# Patient Record
Sex: Female | Born: 1935 | Race: White | Hispanic: No | Smoking: Never smoker
Health system: Southern US, Community
[De-identification: ages and names within clinical notes are randomized; demographics above are authoritative.]

## PROBLEM LIST (undated history)

## (undated) DIAGNOSIS — N8110 Cystocele, unspecified: Secondary | ICD-10-CM

## (undated) DIAGNOSIS — E079 Disorder of thyroid, unspecified: Secondary | ICD-10-CM

## (undated) HISTORY — PX: TOTAL HIP ARTHROPLASTY: SHX124

---

## 2020-06-29 ENCOUNTER — Ambulatory Visit: Payer: Medicare Other | Attending: Orthopedic Surgery | Admitting: Physical Therapy

## 2020-06-29 ENCOUNTER — Other Ambulatory Visit: Payer: Self-pay

## 2020-06-29 ENCOUNTER — Encounter: Payer: Self-pay | Admitting: Physical Therapy

## 2020-06-29 DIAGNOSIS — G8929 Other chronic pain: Secondary | ICD-10-CM | POA: Insufficient documentation

## 2020-06-29 DIAGNOSIS — M25562 Pain in left knee: Secondary | ICD-10-CM | POA: Diagnosis present

## 2020-06-29 DIAGNOSIS — M6281 Muscle weakness (generalized): Secondary | ICD-10-CM | POA: Diagnosis present

## 2020-06-29 NOTE — Therapy (Signed)
[facility] [address] [place], [place], [address] Phone: [phone]   Fax:  [phone]  Physical Therapy Evaluation  Patient Details  Name: Christina Walter MRN: [id number] Date of Birth: [birth year] Referring Provider (PT): Ranee Gosselin MD   Encounter Date: 06/29/2020   PT End of Session - 06/29/20 1041    Visit Number 1    Number of Visits 12    Date for PT Re-Evaluation 07/27/20    Authorization Type PROGRESS NOTE AT 10TH VISIT.  KX MODIFIER AFTER 15 VISITS.    PT Start Time [phone]    PT Stop Time 1032    PT Time Calculation (min) 51 min    Activity Tolerance Patient tolerated treatment well    Behavior During Therapy [facility] for tasks assessed/performed           History reviewed. No pertinent past medical history.  History reviewed. No pertinent surgical history.  There were no vitals filed for this visit.    Subjective Assessment - 06/29/20 1022    Subjective COVID-19 screen performed prior to patient entering clinic.  The patient presents to the clinic today with c/o left knee pain that has been ongoing since about Novemeber of 2021.  She also had a fall and fractured her left hip and underwent a left hip precaution.  She is abding by her hip precautions and not flxing her hip past 90 degrees, turning her toes in and not crossing legs.  She had a recent injection in her left knee which was very helpful and her pain is a low 1/10 today.  Prior to the injection she states she limped all the time.    Pertinent History OP, Thyroid problem, variose vein surgery, left THA (06/03/20...follow hip precautions), hysterectomy, breast cancer (2021).    How long can you walk comfortably? Short community distances with a FWW    Patient Stated Goals Greater ease in getting around.    Currently in Pain? Yes    Pain Score 1     Pain Orientation Left    Pain Descriptors / Indicators Dull;Aching    Pain Type Chronic pain    Pain Onset More than  a month ago    Pain Frequency Constant    Aggravating Factors  Increased up time.    Pain Relieving Factors Cortisone shot.              [facility] PT Assessment - 06/29/20 0001      Assessment   Medical Diagnosis Pain in left knee.    Referring Provider (PT) Ranee Gosselin MD    Onset Date/Surgical Date --   November 2021.     Precautions   Precaution Comments Left hip precautions.  PAIN-FREE LEFT LE THER EX.      Restrictions   Weight Bearing Restrictions No      Balance Screen   Has the patient fallen in the past 6 months Yes    How many times? 2.    Has the patient had a decrease in activity level because of a fear of falling?  No    Is the patient reluctant to leave their home because of a fear of falling?  No      Home Environment   Living Environment Private residence      Prior Function   Level of Independence Independent      Observation/Other Assessments-Edema    Edema Circumferential      Circumferential Edema   Circumferential - Right LT 3  cms > RT.      Posture/Postural Control   Posture Comments Left knee genu valgum.      ROM / Strength   AROM / PROM / Strength AROM;Strength      AROM   Overall AROM Comments Full left knee extension in supine and flexion to 120 degrees.      Strength   Overall Strength Comments Left hip flexion and abduction is 3 to 3+/5, left knee extension is 4-/5 and there is notable quadriceps atrophy.      Palpation   Patella mobility Normal left patellar mobility.    Palpation comment Tender to palpation over patient's left knee medial joint line and Pes Anserine.      Ambulation/Gait   Gait Comments Safe ambulation with a FWW.                      Objective measurements completed on examination: See above findings.       [facility] Adult PT Treatment/Exercise - 06/29/20 0001      Modalities   Modalities Electrical Stimulation;Vasopneumatic      Electrical Stimulation   Electrical Stimulation Location  Left medial knee.    Electrical Stimulation Action IFC at 80-150 Hz.    Electrical Stimulation Parameters 40% scan x 15 minutes.    Electrical Stimulation Goals Edema;Pain      Vasopneumatic   Number Minutes Vasopneumatic  15 minutes    Vasopnuematic Location  --   Left knee.   Vasopneumatic Pressure Low                       PT Long Term Goals - 06/29/20 1113      PT LONG TERM GOAL #1   Title Independent with a HEP.    Time 4    Period Weeks    Status New      PT LONG TERM GOAL #2   Title Perform ADL's with left knee pain not > 2-3/10.    Time 4    Period Weeks    Status New      PT LONG TERM GOAL #3   Title Improve left LE strength to a solid 4+/5 to increasestability for fucntional tasks.    Time 4    Period Weeks    Status New                  Plan - 06/29/20 1108    Clinical Impression Statement The patient presents to OPPT with c/o left knee pain that has been ongoing for several months.  A recent injection was very helpful.  Her knee is remarkable for genu valgum.  Her left hip is weak and she recently had surgery (06/03/20) and she is abiding by her hip precuations.  She has notable left quadriceps atrophy and edema when contralaterally compared.  She is walking safely with a FWW.  Her patellar mobility is normal.  She is tender to palpation over her left knee medial joint line and Pes Anserine region.  Patient will benefit from skilled physical therapy intervention to address pain and deficits.    Personal Factors and Comorbidities Comorbidity 1;Comorbidity 2;Other    Comorbidities OP, Thyroid problem, variose vein surgery, left THA (06/03/20...follow hip precautions), hysterectomy, breast cancer (2021).    Examination-Activity Limitations Locomotion Level;Other    Examination-Participation Restrictions Other    Stability/Clinical Decision Making Stable/Uncomplicated    Clinical Decision Making Low    Rehab Potential Excellent  PT Frequency 2x /  week    PT Duration 4 weeks    PT Treatment/Interventions ADLs/Self Care Home Management;Electrical Stimulation;Cryotherapy;Moist Heat;Ultrasound;Neuromuscular re-education;Therapeutic activities;Functional mobility training;Gait training;Patient/family education;Manual techniques;Passive range of motion;Vasopneumatic Device    PT Next Visit Plan Pain-free left LE ther ex.  Left hip precautions.  Modalites and STW/M as needed.    Consulted and Agree with Plan of Care Patient           Patient will benefit from skilled therapeutic intervention in order to improve the following deficits and impairments:  Abnormal gait,Pain,Decreased activity tolerance,Increased edema,Decreased strength,Decreased range of motion  Visit Diagnosis: Chronic pain of left knee - Plan: PT plan of care cert/re-cert  Muscle weakness (generalized) - Plan: PT plan of care cert/re-cert     Problem List There are no problems to display for this patient.   Adric Wrede, [place] MPT 06/29/2020, 11:19 AM  [facility] [address] [place], [place], [address] Phone: [phone]   Fax:  [phone]  Name: Christina Walter MRN: [id number] Date of Birth: Oct 08, 1934

## 2020-07-01 ENCOUNTER — Other Ambulatory Visit: Payer: Self-pay

## 2020-07-01 ENCOUNTER — Ambulatory Visit: Payer: Medicare Other | Admitting: *Deleted

## 2020-07-01 DIAGNOSIS — M25562 Pain in left knee: Secondary | ICD-10-CM | POA: Diagnosis not present

## 2020-07-01 DIAGNOSIS — M6281 Muscle weakness (generalized): Secondary | ICD-10-CM

## 2020-07-01 DIAGNOSIS — G8929 Other chronic pain: Secondary | ICD-10-CM

## 2020-07-01 NOTE — Therapy (Signed)
[facility] [address] [place], [place], [address] Phone: [phone]   Fax:  [phone]  Physical Therapy Treatment  Patient Details  Name: Shawnell Dykes MRN: [id number] Date of Birth: [birth year] Referring Provider (PT): Ranee Gosselin MD   Encounter Date: 07/01/2020   PT End of Session - 07/01/20 1223    Visit Number 2    Number of Visits 12    Date for PT Re-Evaluation 07/27/20    Authorization Type PROGRESS NOTE AT 10TH VISIT.  KX MODIFIER AFTER 15 VISITS.    PT Start Time 0945    PT Stop Time 1031    PT Time Calculation (min) 46 min           No past medical history on file.  No past surgical history on file.  There were no vitals filed for this visit.   Subjective Assessment - 07/01/20 0957    Subjective COVID-19 screen performed prior to patient entering clinic.Doing good    Pertinent History OP, Thyroid problem, variose vein surgery, left THA (06/03/20...follow hip precautions), hysterectomy, breast cancer (2021).    How long can you walk comfortably? Short community distances with a FWW    Patient Stated Goals Greater ease in getting around.    Currently in Pain? Yes    Pain Score 2     Pain Orientation Left    Pain Descriptors / Indicators Aching;Dull                             [facility] Adult PT Treatment/Exercise - 07/01/20 0001      Self-Care   Self-Care --   HEP handout given     Exercises   Exercises Knee/Hip;Ankle      Knee/Hip Exercises: Aerobic   Nustep L1 x 10 mins within hip precations. Pt to focus on good Jt alignment LT LE and prevent hip IR      Knee/Hip Exercises: Standing   Heel Raises Both;2 sets;10 reps    Heel Raises Limitations Toe raises 2x10    Hip Abduction AROM;Left;2 sets;10 reps    Rocker Board 4 minutes   PF/DF CGA     Knee/Hip Exercises: Seated   Long Arc Quad Strengthening;AROM;Left;1 set;10 reps   2x10 with 2# Wt. and ball at knees                       PT Long Term Goals - 06/29/20 1113      PT LONG TERM GOAL #1   Title Independent with a HEP.    Time 4    Period Weeks    Status New      PT LONG TERM GOAL #2   Title Perform ADL's with left knee pain not > 2-3/10.    Time 4    Period Weeks    Status New      PT LONG TERM GOAL #3   Title Improve left LE strength to a solid 4+/5 to increasestability for fucntional tasks.    Time 4    Period Weeks    Status New                 Plan - 07/01/20 1224    Clinical Impression Statement Pt arrived today using FWW for ambulation and doing fairly well. She was able to perform Nustep today , but needed to focus on preventing LT hip IR while performing. Rx focused on sitting and  standing LT LE strengthening in sitting as well as sitting. No reports of increased LT knee pain with today's Rx.    Comorbidities OP, Thyroid problem, variose vein surgery, left THA (06/03/20...follow hip precautions), hysterectomy, breast cancer (2021).    Stability/Clinical Decision Making Stable/Uncomplicated    Rehab Potential Excellent    PT Frequency 2x / week    PT Duration 4 weeks    PT Treatment/Interventions ADLs/Self Care Home Management;Electrical Stimulation;Cryotherapy;Moist Heat;Ultrasound;Neuromuscular re-education;Therapeutic activities;Functional mobility training;Gait training;Patient/family education;Manual techniques;Passive range of motion;Vasopneumatic Device    PT Next Visit Plan Pain-free left LE ther ex.  Left hip precautions.  Modalites and STW/M as needed.           Patient will benefit from skilled therapeutic intervention in order to improve the following deficits and impairments:  Abnormal gait,Pain,Decreased activity tolerance,Increased edema,Decreased strength,Decreased range of motion  Visit Diagnosis: Chronic pain of left knee  Muscle weakness (generalized)     Problem List There are no problems to display for this  patient.   Emmagene Ortner,CHRIS, PTA 07/01/2020, 12:28 PM  [facility] [address] [place], [place], [address] Phone: [phone]   Fax:  [phone]  Name: Elna Radovich MRN: [id number] Date of Birth: [birth year]

## 2020-07-05 ENCOUNTER — Encounter: Payer: Medicare Other | Admitting: *Deleted

## 2020-07-06 ENCOUNTER — Other Ambulatory Visit: Payer: Self-pay

## 2020-07-06 ENCOUNTER — Encounter: Payer: Self-pay | Admitting: Physical Therapy

## 2020-07-06 ENCOUNTER — Ambulatory Visit: Payer: Medicare Other | Admitting: Physical Therapy

## 2020-07-06 DIAGNOSIS — M25562 Pain in left knee: Secondary | ICD-10-CM | POA: Diagnosis not present

## 2020-07-06 DIAGNOSIS — M6281 Muscle weakness (generalized): Secondary | ICD-10-CM

## 2020-07-06 DIAGNOSIS — G8929 Other chronic pain: Secondary | ICD-10-CM

## 2020-07-06 NOTE — Therapy (Signed)
[facility] [address] [place], [place], [address] Phone: [phone]   Fax:  [phone]  Physical Therapy Treatment  Patient Details  Name: Christina Walter MRN: [id number] Date of Birth: [birth year] Referring Provider (PT): Ranee Gosselin MD   Encounter Date: 07/06/2020   PT End of Session - 07/06/20 0952    Visit Number 3    Number of Visits 12    Date for PT Re-Evaluation 07/27/20    Authorization Type PROGRESS NOTE AT 10TH VISIT.  KX MODIFIER AFTER 15 VISITS.    PT Start Time [phone]    PT Stop Time 1030    PT Time Calculation (min) 43 min    Activity Tolerance Patient tolerated treatment well    Behavior During Therapy [facility] for tasks assessed/performed           History reviewed. No pertinent past medical history.  History reviewed. No pertinent surgical history.  There were no vitals filed for this visit.   Subjective Assessment - 07/06/20 0951    Subjective COVID-19 screen performed prior to patient entering clinic.Doing good    Pertinent History OP, Thyroid problem, variose vein surgery, left THA (06/03/20...follow hip precautions), hysterectomy, breast cancer (2021).    How long can you walk comfortably? Short community distances with a FWW    Patient Stated Goals Greater ease in getting around.    Currently in Pain? No/denies              [facility] PT Assessment - 07/06/20 0001      Assessment   Medical Diagnosis Pain in left knee.    Referring Provider (PT) Ranee Gosselin MD      Precautions   Precaution Comments Left hip precautions.  PAIN-FREE LEFT LE THER EX.                         [facility] Adult PT Treatment/Exercise - 07/06/20 0001      Knee/Hip Exercises: Aerobic   Nustep L3 x18 min   focus on avoiding hip adduction LLE     Knee/Hip Exercises: Standing   Heel Raises Both;2 sets;10 reps    Heel Raises Limitations Toe raises 2x10    Hip Flexion AROM;Both;2 sets;10 reps;Knee bent    Hip  Abduction AROM;Both;2 sets;10 reps;Knee straight    Functional Squat 15 reps;3 seconds   mini squat     Knee/Hip Exercises: Seated   Long Arc Quad AROM;Left;2 sets;10 reps    Long Arc Quad Limitations LAQ with ball squeeze x20 reps    Hamstring Curl Strengthening;Left;2 sets;10 reps;Limitations    Hamstring Limitations yellow theraband      Knee/Hip Exercises: Supine   Bridges Strengthening;20 reps                       PT Long Term Goals - 07/06/20 1153      PT LONG TERM GOAL #1   Title Independent with a HEP.    Time 4    Period Weeks    Status Achieved      PT LONG TERM GOAL #2   Title Perform ADL's with left knee pain not > 2-3/10.    Time 4    Period Weeks    Status On-going      PT LONG TERM GOAL #3   Title Improve left LE strength to a solid 4+/5 to increasestability for fucntional tasks.    Time 4    Period Weeks  Status On-going                 Plan - 07/06/20 1153    Clinical Impression Statement Patient presented in clinic with reports of no pain and compliance with HEP. Patient progressed through AROM or lightly resisted exercises with fatigue reported. Weakness noted especially with L hip abduction and seated clam.    Personal Factors and Comorbidities Comorbidity 1;Comorbidity 2;Other    Comorbidities OP, Thyroid problem, variose vein surgery, left THA (06/03/20...follow hip precautions), hysterectomy, breast cancer (2021).    Examination-Activity Limitations Locomotion Level;Other    Examination-Participation Restrictions Other    Stability/Clinical Decision Making Stable/Uncomplicated    Rehab Potential Excellent    PT Frequency 2x / week    PT Duration 4 weeks    PT Treatment/Interventions ADLs/Self Care Home Management;Electrical Stimulation;Cryotherapy;Moist Heat;Ultrasound;Neuromuscular re-education;Therapeutic activities;Functional mobility training;Gait training;Patient/family education;Manual techniques;Passive range of  motion;Vasopneumatic Device    PT Next Visit Plan Pain-free left LE ther ex.  Left hip precautions.  Modalites and STW/M as needed.    Consulted and Agree with Plan of Care Patient           Patient will benefit from skilled therapeutic intervention in order to improve the following deficits and impairments:  Abnormal gait,Pain,Decreased activity tolerance,Increased edema,Decreased strength,Decreased range of motion  Visit Diagnosis: Chronic pain of left knee  Muscle weakness (generalized)     Problem List There are no problems to display for this patient.   Marvell Fuller, PTA 07/06/2020, 11:57 AM  [facility] [address] [place], [place], [address] Phone: [phone]   Fax:  [phone]  Name: Amyjo Mizrachi MRN: [id number] Date of Birth: June 18, 1934

## 2020-07-08 ENCOUNTER — Ambulatory Visit: Payer: Medicare Other | Admitting: *Deleted

## 2020-07-08 ENCOUNTER — Other Ambulatory Visit: Payer: Self-pay

## 2020-07-08 DIAGNOSIS — M6281 Muscle weakness (generalized): Secondary | ICD-10-CM

## 2020-07-08 DIAGNOSIS — M25562 Pain in left knee: Secondary | ICD-10-CM | POA: Diagnosis not present

## 2020-07-08 DIAGNOSIS — G8929 Other chronic pain: Secondary | ICD-10-CM

## 2020-07-08 NOTE — Therapy (Signed)
[facility] [address] [place], [place], [address] Phone: [phone]   Fax:  [phone]  Physical Therapy Treatment  Patient Details  Name: Christina Walter MRN: [id number] Date of Birth: Feb 14, 1935 Referring Provider (PT): Ranee Gosselin MD   Encounter Date: 07/08/2020   PT End of Session - 07/08/20 0949    Visit Number 4    Number of Visits 12    Date for PT Re-Evaluation 07/27/20    Authorization Type PROGRESS NOTE AT 10TH VISIT.  KX MODIFIER AFTER 15 VISITS.    PT Start Time 0945    PT Stop Time 1034    PT Time Calculation (min) 49 min           No past medical history on file.  No past surgical history on file.  There were no vitals filed for this visit.   Subjective Assessment - 07/08/20 0947    Subjective COVID-19 screen performed prior to patient entering clinic.Doing good    Pertinent History OP, Thyroid problem, variose vein surgery, left THA (06/03/20...follow hip precautions), hysterectomy, breast cancer (2021).    How long can you walk comfortably? Short community distances with a FWW    Patient Stated Goals Greater ease in getting around.    Currently in Pain? Yes    Pain Score 2     Pain Location Knee    Pain Orientation Left    Pain Descriptors / Indicators Aching;Dull    Pain Type Chronic pain                             [facility] Adult PT Treatment/Exercise - 07/08/20 0001      Exercises   Exercises Knee/Hip;Ankle      Knee/Hip Exercises: Aerobic   Nustep L3 x61min   focus on avoiding hip adduction/ IR LLE     Knee/Hip Exercises: Standing   Heel Raises Both;2 sets;10 reps    Heel Raises Limitations Toe raises 2x10    Hip Flexion AROM;Both;2 sets;10 reps;Knee bent   6 in box   Hip Abduction AROM;Both;2 sets;10 reps;Knee straight    Functional Squat 15 reps;3 seconds   on raised mat. Cues for technique     Knee/Hip Exercises: Seated   Long Arc Quad AROM;Left;2 sets;10 reps   hold 5  secs   Long Arc Quad Weight 2 lbs.    Hamstring Curl Strengthening;Left;2 sets;10 reps;Limitations    Hamstring Limitations yellow theraband                       PT Long Term Goals - 07/06/20 1153      PT LONG TERM GOAL #1   Title Independent with a HEP.    Time 4    Period Weeks    Status Achieved      PT LONG TERM GOAL #2   Title Perform ADL's with left knee pain not > 2-3/10.    Time 4    Period Weeks    Status On-going      PT LONG TERM GOAL #3   Title Improve left LE strength to a solid 4+/5 to increasestability for fucntional tasks.    Time 4    Period Weeks    Status On-going                 Plan - 07/08/20 1155    Clinical Impression Statement Pt arrived today doing fairly well with no  pain in LT knee. She was able to perform standing and seated exs for LT LE with in hip precautions and did well. Pt  able to focus on keeping LT hip in neutral position and not adducting/ IR.    Personal Factors and Comorbidities Comorbidity 1;Comorbidity 2;Other    Comorbidities OP, Thyroid problem, variose vein surgery, left THA (06/03/20...follow hip precautions), hysterectomy, breast cancer (2021).    Examination-Activity Limitations Locomotion Level;Other    Examination-Participation Restrictions Other    Rehab Potential Excellent    PT Frequency 2x / week    PT Duration 4 weeks    PT Treatment/Interventions ADLs/Self Care Home Management;Electrical Stimulation;Cryotherapy;Moist Heat;Ultrasound;Neuromuscular re-education;Therapeutic activities;Functional mobility training;Gait training;Patient/family education;Manual techniques;Passive range of motion;Vasopneumatic Device    PT Next Visit Plan Pain-free left LE ther ex.  Left hip precautions.  Modalites and STW/M as needed.    Consulted and Agree with Plan of Care Patient           Patient will benefit from skilled therapeutic intervention in order to improve the following deficits and impairments:  Abnormal  gait,Pain,Decreased activity tolerance,Increased edema,Decreased strength,Decreased range of motion  Visit Diagnosis: Chronic pain of left knee  Muscle weakness (generalized)     Problem List There are no problems to display for this patient.   Osceola Holian,CHRIS , PTA 07/08/2020, 12:13 PM  [facility] [address] [place], [place], [address] Phone: [phone]   Fax:  [phone]  Name: Christina Walter MRN: [id number] Date of Birth: 08-31-1934

## 2020-07-12 ENCOUNTER — Ambulatory Visit: Payer: Medicare Other | Admitting: *Deleted

## 2020-07-13 ENCOUNTER — Encounter (HOSPITAL_BASED_OUTPATIENT_CLINIC_OR_DEPARTMENT_OTHER): Payer: Self-pay

## 2020-07-13 ENCOUNTER — Emergency Department (HOSPITAL_BASED_OUTPATIENT_CLINIC_OR_DEPARTMENT_OTHER)
Admission: EM | Admit: 2020-07-13 | Discharge: 2020-07-13 | Disposition: A | Discharge status: Home / Self Care | Payer: Medicare Other | Attending: Emergency Medicine | Admitting: Emergency Medicine

## 2020-07-13 ENCOUNTER — Emergency Department (HOSPITAL_BASED_OUTPATIENT_CLINIC_OR_DEPARTMENT_OTHER): Payer: Medicare Other

## 2020-07-13 ENCOUNTER — Other Ambulatory Visit: Payer: Self-pay

## 2020-07-13 DIAGNOSIS — J189 Pneumonia, unspecified organism: Secondary | ICD-10-CM

## 2020-07-13 DIAGNOSIS — J181 Lobar pneumonia, unspecified organism: Secondary | ICD-10-CM | POA: Diagnosis not present

## 2020-07-13 DIAGNOSIS — R0981 Nasal congestion: Secondary | ICD-10-CM | POA: Diagnosis present

## 2020-07-13 DIAGNOSIS — Z20822 Contact with and (suspected) exposure to covid-19: Secondary | ICD-10-CM | POA: Insufficient documentation

## 2020-07-13 HISTORY — DX: Disorder of thyroid, unspecified: E07.9

## 2020-07-13 LAB — SARS CORONAVIRUS 2 (TAT 6-24 HRS): SARS Coronavirus 2: NEGATIVE

## 2020-07-13 MED ORDER — AZITHROMYCIN 250 MG PO TABS: 250.0000 mg | ORAL_TABLET | Freq: Every day | ORAL | 0 refills | Status: AC

## 2020-07-13 MED ORDER — BENZONATATE 100 MG PO CAPS: 100.0000 mg | ORAL_CAPSULE | Freq: Three times a day (TID) | ORAL | 0 refills | Status: AC

## 2020-07-13 NOTE — Discharge Instructions (Signed)
Your chest x-ray showed a pneumonia.  We have started you on antibiotics.  You will be called if your COVID test is positive.  Return for any worsening symptoms

## 2020-07-13 NOTE — ED Triage Notes (Signed)
Pt arrives with c/o nasal congestion since this weekend states that she has been waking up coughing r/t sinus drainage, also loss of voice.

## 2020-07-13 NOTE — ED Provider Notes (Signed)
[facility] EMERGENCY DEPARTMENT Provider Note   CSN: [id number] Arrival date & time: 07/13/20  1104     History Chief Complaint  Patient presents with  . Nasal Congestion    Christina Walter is a 86 y.o. female with no significant past medical history who presents for evaluation of upper respiratory complaints.  On Sunday noted runny nose, pharyngitis, cough.  Cough nonproductive.  No PND or orthopnea.  Family had similar complaints a few weeks ago however resolved.  She is vaccinated for COVID.  No fever, chills, nausea, vomiting, chest pain, shortness of breath abdominal pain, diarrhea, dysuria.  She is tolerating p.o. intake without difficulty.  She has noted a hoarse voice.  Denies any unilateral leg swelling, redness or warmth.  No history of PE or DVT. Denies additional aggravating or alleviating factors.  Fall 6 weeks ago. Seen at OSH, had left hip fracture requiring surgical intervention. Has been doing well since prior fall. No recent falls.  History obtained from patient, family in room past medical records.  No interpreter used  HPI     Past Medical History:  Diagnosis Date  . Thyroid disease     There are no problems to display for this patient.   Past Surgical History:  Procedure Laterality Date  . TOTAL HIP ARTHROPLASTY       OB History   No obstetric history on file.     No family history on file.  Social History   Tobacco Use  . Smoking status: Never Smoker  . Smokeless tobacco: Never Used  Substance Use Topics  . Alcohol use: Never  . Drug use: Never    Home Medications Prior to Admission medications   Medication Sig Start Date End Date Taking? Authorizing Provider  azithromycin (ZITHROMAX) 250 MG tablet Take 1 tablet (250 mg total) by mouth daily. Take first 2 tablets together, then 1 every day until finished. 07/13/20  Yes Vi Biddinger A, PA-C  benzonatate (TESSALON) 100 MG capsule Take 1 capsule (100 mg total) by mouth every 8  (eight) hours. 07/13/20  Yes Leman Martinek A, PA-C  Thyroid (LEVOTHYROXINE-LIOTHYRONINE PO) Take by mouth.    [provider]    Allergies    Patient has no known allergies.  Review of Systems   Review of Systems  HENT: Positive for postnasal drip, rhinorrhea and voice change.   Respiratory: Positive for cough. Negative for apnea, choking, chest tightness, shortness of breath, wheezing and stridor.   Cardiovascular: Negative.   Gastrointestinal: Negative.   Genitourinary: Negative.   Musculoskeletal: Negative.   Skin: Negative.   Neurological: Negative.   All other systems reviewed and are negative.   Physical Exam Updated Vital Signs BP 133/74 (BP Location: Left Arm)   Pulse (!) 109   Temp 98.3 F (36.8 C) (Oral)   Resp 20   Ht 5\' 3"  (1.6 m)   Wt 50.8 kg   SpO2 97%   BMI 19.84 kg/m   Physical Exam Vitals and nursing note reviewed.  Constitutional:      General: She is not in acute distress.    Appearance: She is well-developed. She is not ill-appearing, toxic-appearing or diaphoretic.  HENT:     Head: Normocephalic and atraumatic.     Right Ear: Tympanic membrane, ear canal and external ear normal.     Left Ear: Tympanic membrane, ear canal and external ear normal.     Nose: Nose normal.     Mouth/Throat:     Comments: Mild  posterior oropharyngeal erythema however no exudate.  Tonsils without edema or exudate.  Mild hoarse voice however no drooling, dysphagia or trismus. Eyes:     Pupils: Pupils are equal, round, and reactive to light.  Cardiovascular:     Rate and Rhythm: Normal rate.     Pulses: Normal pulses.     Heart sounds: Normal heart sounds.  Pulmonary:     Effort: Pulmonary effort is normal. No respiratory distress.     Breath sounds: Normal breath sounds.     Comments: Clear to auscultation bilaterally.  Speaks in full sentences without difficulty Abdominal:     General: Bowel sounds are normal. There is no distension.     Palpations:  Abdomen is soft.  Musculoskeletal:        General: Normal range of motion.     Cervical back: Normal range of motion.     Comments: Compartments soft.  No bony tenderness.  Skin:    General: Skin is warm and dry.     Capillary Refill: Capillary refill takes less than 2 seconds.  Neurological:     General: No focal deficit present.     Mental Status: She is alert and oriented to person, place, and time.     ED Results / Procedures / Treatments   Labs (all labs ordered are listed, but only abnormal results are displayed) Labs Reviewed  SARS CORONAVIRUS 2 (TAT 6-24 HRS)    EKG None  Radiology DG Chest Portable 1 View  Result Date: 07/13/2020 CLINICAL DATA:  Cough. EXAM: PORTABLE CHEST 1 VIEW COMPARISON:  None. FINDINGS: Multiple mildly displaced left posterior rib fractures. Prominent calcified costochondral cartilage at multiple levels. Left basilar opacities. No visible pleural effusions or pneumothorax. Right axillary clips. IMPRESSION: 1. Left basilar opacities, which could represent atelectasis, aspiration, and/or pneumonia. 2. Multiple mildly displaced left posterior rib fractures, age indeterminate without priors but possibly remote. Recommend correlation with a history of trauma or tenderness. Electronically Signed   By: Feliberto Harts MD   On: 07/13/2020 13:09    Procedures Procedures   Medications Ordered in ED Medications - No data to display  ED Course  I have reviewed the triage vital signs and the nursing notes.  Pertinent labs & imaging results that were available during my care of the patient were reviewed by me and considered in my medical decision making (see chart for details).  15 old here for evaluation of cough, rhinorrhea and hoarse voice. She is afebrile, nonseptic, non-ill-appearing.  Heart and lungs clear.  Abdomen soft, nontender.  Does have some clear rhinorrhea.  Posterior oropharynx with some mild erythema however no evidence of tonsillar edema  or exudate.  Low suspicion for PTA, RPA or deep space infection.  No pooling of secretions.  Does have a mild hoarse voice.  She has no neck stiffness or neck rigidity.  No meningismus.  She is tolerating p.o. intake without difficulty.  Chest x-ray here shows possible developing pneumonia.  She also has an undetermined rib fracture.  Patient did have a fall approximately 6 weeks ago.  This is likely an old fracture.  She has no overlying tenderness.  Will obtain COVID test, treat for symptomatic management.  Low suspicion for lung contusion, acute intrathoracic etiology which would require inpatient admission or surgical intervention.  She will return for any worsening symptoms.  The patient has been appropriately medically screened and/or stabilized in the ED. I have low suspicion for any other emergent medical condition which would require  further screening, evaluation or treatment in the ED or require inpatient management.  Patient is hemodynamically stable and in no acute distress.  Patient able to ambulate in department prior to ED.  Evaluation does not show acute pathology that would require ongoing or additional emergent interventions while in the emergency department or further inpatient treatment.  I have discussed the diagnosis with the patient and answered all questions.  Pain is been managed while in the emergency department and patient has no further complaints prior to discharge.  Patient is comfortable with plan discussed in room and is stable for discharge at this time.  I have discussed strict return precautions for returning to the emergency department.  Patient was encouraged to follow-up with PCP/specialist refer to at discharge.    MDM Rules/Calculators/A&P                         Christina Walter was evaluated in Emergency Department on 07/13/2020 for the symptoms described in the history of present illness. She was evaluated in the context of the global COVID-19 pandemic, which  necessitated consideration that the patient might be at risk for infection with the SARS-CoV-2 virus that causes COVID-19. Institutional protocols and algorithms that pertain to the evaluation of patients at risk for COVID-19 are in a state of rapid change based on information released by regulatory bodies including the CDC and federal and state organizations. These policies and algorithms were followed during the patient's care in the ED. Final Clinical Impression(s) / ED Diagnoses Final diagnoses:  Community acquired pneumonia of left lower lobe of lung    Rx / DC Orders ED Discharge Orders         Ordered    azithromycin (ZITHROMAX) 250 MG tablet  Daily        07/13/20 1403    benzonatate (TESSALON) 100 MG capsule  Every 8 hours        07/13/20 1403           Kaleah Hagemeister A, PA-C 07/13/20 1410    Milagros Loll, MD 07/14/20 [phone]

## 2020-07-13 NOTE — ED Notes (Signed)
States symptoms started Saturday, runny nose & loss of voice.  Cough mainly at night

## 2020-07-14 ENCOUNTER — Encounter: Payer: Medicare Other | Admitting: Physical Therapy

## 2020-07-19 ENCOUNTER — Other Ambulatory Visit: Payer: Self-pay

## 2020-07-19 ENCOUNTER — Encounter: Payer: Self-pay | Admitting: Physical Therapy

## 2020-07-19 ENCOUNTER — Ambulatory Visit: Payer: Medicare Other | Admitting: Physical Therapy

## 2020-07-19 DIAGNOSIS — G8929 Other chronic pain: Secondary | ICD-10-CM

## 2020-07-19 DIAGNOSIS — M6281 Muscle weakness (generalized): Secondary | ICD-10-CM

## 2020-07-19 DIAGNOSIS — M25562 Pain in left knee: Secondary | ICD-10-CM | POA: Diagnosis not present

## 2020-07-19 NOTE — Therapy (Signed)
[facility] [address] [place], [place], [address] Phone: [phone]   Fax:  [phone]  Physical Therapy Treatment  Patient Details  Name: Christina Walter MRN: [id number] Date of Birth: March 26, 1934 Referring Provider (PT): Ranee Gosselin MD   Encounter Date: 07/19/2020   PT End of Session - 07/19/20 1015    Visit Number 5    Number of Visits 12    Date for PT Re-Evaluation 07/27/20    Authorization Type PROGRESS NOTE AT 10TH VISIT.  KX MODIFIER AFTER 15 VISITS.    PT Start Time [phone]    PT Stop Time 1030    PT Time Calculation (min) 44 min    Activity Tolerance Patient tolerated treatment well    Behavior During Therapy WFL for tasks assessed/performed           Past Medical History:  Diagnosis Date  . Thyroid disease     Past Surgical History:  Procedure Laterality Date  . TOTAL HIP ARTHROPLASTY      There were no vitals filed for this visit.   Subjective Assessment - 07/19/20 1013    Subjective COVID-19 screen performed prior to patient entering clinic. Reporting more L knee pain recently.    Pertinent History OP, Thyroid problem, variose vein surgery, left THA (06/03/20...follow hip precautions), hysterectomy, breast cancer (2021).    How long can you walk comfortably? Short community distances with a FWW    Patient Stated Goals Greater ease in getting around.    Currently in Pain? Yes    Pain Score --   No pain score provided   Pain Location Knee    Pain Orientation Left    Pain Descriptors / Indicators Nagging    Pain Type Chronic pain    Pain Onset More than a month ago    Pain Frequency Intermittent              [facility] PT Assessment - 07/19/20 0001      Assessment   Medical Diagnosis Pain in left knee.    Referring Provider (PT) Ranee Gosselin MD      Precautions   Precaution Comments Left hip precautions.  PAIN-FREE LEFT LE THER EX.      Restrictions   Weight Bearing Restrictions No       Observation/Other Assessments-Edema    Edema Circumferential      Circumferential Edema   Circumferential - Right 38 cm    Circumferential - Left  39.5 cm                         [facility] Adult PT Treatment/Exercise - 07/19/20 0001      Knee/Hip Exercises: Aerobic   Nustep L3 x55min      Knee/Hip Exercises: Standing   Heel Raises Both;2 sets;10 reps    Heel Raises Limitations Toe raises 2x10    Hip Flexion AROM;Left;2 sets;10 reps;Knee bent    Hip Abduction AROM;Left;2 sets;10 reps;Knee straight      Knee/Hip Exercises: Seated   Long Arc Quad AROM;Left;2 sets;10 reps    [employer] x20 reps    Clamshell with TheraBand Yellow   x15 reps   Hamstring Curl Strengthening;Left;2 sets;10 reps;Limitations    Hamstring Limitations yellow theraband                       PT Long Term Goals - 07/06/20 1153      PT LONG TERM GOAL #1  Title Independent with a HEP.    Time 4    Period Weeks    Status Achieved      PT LONG TERM GOAL #2   Title Perform ADL's with left knee pain not > 2-3/10.    Time 4    Period Weeks    Status On-going      PT LONG TERM GOAL #3   Title Improve left LE strength to a solid 4+/5 to increasestability for fucntional tasks.    Time 4    Period Weeks    Status On-going                 Plan - 07/19/20 1114    Clinical Impression Statement Patient presented in clinic with reports of greater L knee pain. Increased edema noted in L knee as well with 1.5 cm difference in L knee than R knee. Patient able to tolerate some therex in sitting or standing but limited by fatigue and L knee pain. Patient demonstrated greater difficulty and weakness regarding hip clam in sitting and required tactile blocking of L foot. Patient instructed in cryotherapy and elevation to assist with edema in L knee.    Personal Factors and Comorbidities Comorbidity 1;Comorbidity 2;Other    Comorbidities OP, Thyroid problem, variose vein surgery, left  THA (06/03/20...follow hip precautions), hysterectomy, breast cancer (2021).    Examination-Activity Limitations Locomotion Level;Other    Examination-Participation Restrictions Other    Stability/Clinical Decision Making Stable/Uncomplicated    Rehab Potential Excellent    PT Frequency 2x / week    PT Duration 4 weeks    PT Treatment/Interventions ADLs/Self Care Home Management;Electrical Stimulation;Cryotherapy;Moist Heat;Ultrasound;Neuromuscular re-education;Therapeutic activities;Functional mobility training;Gait training;Patient/family education;Manual techniques;Passive range of motion;Vasopneumatic Device    PT Next Visit Plan Pain-free left LE ther ex.  Left hip precautions.  Modalites and STW/M as needed.    Consulted and Agree with Plan of Care Patient           Patient will benefit from skilled therapeutic intervention in order to improve the following deficits and impairments:  Abnormal gait,Pain,Decreased activity tolerance,Increased edema,Decreased strength,Decreased range of motion  Visit Diagnosis: Chronic pain of left knee  Muscle weakness (generalized)     Problem List There are no problems to display for this patient.   Marvell Fuller, PTA 07/19/2020, 11:25 AM  [facility] [address] [place], [place], [address] Phone: [phone]   Fax:  [phone]  Name: Christina Walter MRN: [id number] Date of Birth: July 28, 1934

## 2020-07-22 ENCOUNTER — Ambulatory Visit: Payer: Medicare Other | Admitting: Physical Therapy

## 2020-07-22 ENCOUNTER — Encounter: Payer: Self-pay | Admitting: Physical Therapy

## 2020-07-22 ENCOUNTER — Other Ambulatory Visit: Payer: Self-pay

## 2020-07-22 DIAGNOSIS — M25562 Pain in left knee: Secondary | ICD-10-CM | POA: Diagnosis not present

## 2020-07-22 DIAGNOSIS — G8929 Other chronic pain: Secondary | ICD-10-CM

## 2020-07-22 DIAGNOSIS — M6281 Muscle weakness (generalized): Secondary | ICD-10-CM

## 2020-07-22 NOTE — Therapy (Addendum)
[facility] [address] [place], [place], [address] Phone: [phone]   Fax:  [phone]  Physical Therapy Treatment  Patient Details  Name: Christina Walter MRN: [id number] Date of Birth: 1934/07/12 Referring Provider (PT): Ranee Gosselin MD   Encounter Date: 07/22/2020   PT End of Session - 07/22/20 0948    Visit Number 6    Number of Visits 12    Date for PT Re-Evaluation 07/27/20    Authorization Type PROGRESS NOTE AT 10TH VISIT.  KX MODIFIER AFTER 15 VISITS.    PT Start Time [phone]    PT Stop Time 1029    PT Time Calculation (min) 42 min    Activity Tolerance Patient tolerated treatment well    Behavior During Therapy WFL for tasks assessed/performed           Past Medical History:  Diagnosis Date  . Thyroid disease     Past Surgical History:  Procedure Laterality Date  . TOTAL HIP ARTHROPLASTY      There were no vitals filed for this visit.   Subjective Assessment - 07/22/20 0948    Subjective COVID-19 screen performed prior to patient entering clinic. Reporting L knee pain is slightly better. Has been icing it at home.    Pertinent History OP, Thyroid problem, variose vein surgery, left THA (06/03/20...follow hip precautions), hysterectomy, breast cancer (2021).    How long can you walk comfortably? Short community distances with a FWW    Patient Stated Goals Greater ease in getting around.    Currently in Pain? Yes    Pain Score 1     Pain Location Knee    Pain Orientation Left    Pain Descriptors / Indicators Discomfort    Pain Type Chronic pain    Pain Onset More than a month ago    Pain Frequency Intermittent              [facility] PT Assessment - 07/22/20 0001      Assessment   Medical Diagnosis Pain in left knee.    Referring Provider (PT) Ranee Gosselin MD    Next MD Visit 08/04/2020      Precautions   Precaution Comments Left hip precautions.  PAIN-FREE LEFT LE THER EX.      Restrictions   Weight  Bearing Restrictions No                         [facility] Adult PT Treatment/Exercise - 07/22/20 0001      Knee/Hip Exercises: Aerobic   Nustep L2 x15 min      Knee/Hip Exercises: Standing   Heel Raises Both;2 sets;10 reps    Heel Raises Limitations Toe raises 2x10    Knee Flexion AROM;Both;2 sets;10 reps    Hip Flexion AROM;Left;2 sets;10 reps;Knee bent    Hip Abduction AROM;Left;2 sets;10 reps;Knee straight    Forward Step Up Left;10 reps;Hand Hold: 2;Step Height: 4"    Other Standing Knee Exercises sidesteping yellow theraband x 3RT      Knee/Hip Exercises: Seated   Long Arc Quad Strengthening;Left;15 reps;Weights    Long Arc Quad Weight 2 lbs.    Sit to Sand 10 reps;without UE support                       PT Long Term Goals - 07/06/20 1153      PT LONG TERM GOAL #1   Title Independent with a HEP.  Time 4    Period Weeks    Status Achieved      PT LONG TERM GOAL #2   Title Perform ADL's with left knee pain not > 2-3/10.    Time 4    Period Weeks    Status On-going      PT LONG TERM GOAL #3   Title Improve left LE strength to a solid 4+/5 to increasestability for fucntional tasks.    Time 4    Period Weeks    Status On-going                 Plan - 07/22/20 1040    Clinical Impression Statement Patient presented in clinic with reports of L knee discomfort. Patient able to tolerate therex fairly well although some L hip discomfort reported on Nustep. L genu valgus noted while on Nustep and with therex. VCs provided during PT session to avoid genu valgus to reduce compensation and improve hip/knee strength. Patient reported discomfort and difficulty as well with L forward step ups. Patient encouraged to use RLE for steps while in her home enviroment. Patient reports walking around her home without AD as she furniture walks.    Personal Factors and Comorbidities Comorbidity 1;Comorbidity 2;Other    Comorbidities OP, Thyroid problem,  variose vein surgery, left THA (06/03/20...follow hip precautions), hysterectomy, breast cancer (2021).    Examination-Activity Limitations Locomotion Level;Other    Examination-Participation Restrictions Other    Stability/Clinical Decision Making Stable/Uncomplicated    Rehab Potential Excellent    PT Frequency 2x / week    PT Duration 4 weeks    PT Treatment/Interventions ADLs/Self Care Home Management;Electrical Stimulation;Cryotherapy;Moist Heat;Ultrasound;Neuromuscular re-education;Therapeutic activities;Functional mobility training;Gait training;Patient/family education;Manual techniques;Passive range of motion;Vasopneumatic Device    PT Next Visit Plan Pain-free left LE ther ex.  Left hip precautions.  Modalites and STW/M as needed.    Consulted and Agree with Plan of Care Patient           Patient will benefit from skilled therapeutic intervention in order to improve the following deficits and impairments:  Abnormal gait,Pain,Decreased activity tolerance,Increased edema,Decreased strength,Decreased range of motion  Visit Diagnosis: Chronic pain of left knee  Muscle weakness (generalized)     Problem List There are no problems to display for this patient.   Marvell Fuller, PTA 07/22/2020, 11:41 AM  [facility] [address] [place], [place], [address] Phone: [phone]   Fax:  [phone]  Name: Christina Walter MRN: [id number] Date of Birth: 04-18-1934

## 2020-07-28 ENCOUNTER — Ambulatory Visit: Payer: Medicare Other | Attending: Orthopedic Surgery | Admitting: Physical Therapy

## 2020-07-28 ENCOUNTER — Encounter: Payer: Self-pay | Admitting: Physical Therapy

## 2020-07-28 ENCOUNTER — Other Ambulatory Visit: Payer: Self-pay

## 2020-07-28 DIAGNOSIS — M6281 Muscle weakness (generalized): Secondary | ICD-10-CM | POA: Diagnosis present

## 2020-07-28 DIAGNOSIS — M25562 Pain in left knee: Secondary | ICD-10-CM | POA: Insufficient documentation

## 2020-07-28 DIAGNOSIS — G8929 Other chronic pain: Secondary | ICD-10-CM | POA: Insufficient documentation

## 2020-07-28 NOTE — Therapy (Signed)
[facility] [address] [place], [place], [address] Phone: [phone]   Fax:  [phone]  Physical Therapy Treatment  Patient Details  Name: Christina Walter MRN: [id number] Date of Birth: 1934/06/06 Referring Provider (PT): Ranee Gosselin MD   Encounter Date: 07/28/2020   PT End of Session - 07/28/20 1024    Visit Number 7    Number of Visits 12    Date for PT Re-Evaluation 07/27/20    Authorization Type PROGRESS NOTE AT 10TH VISIT.  KX MODIFIER AFTER 15 VISITS.    PT Start Time [phone]    PT Stop Time 1034    PT Time Calculation (min) 47 min    Activity Tolerance Patient tolerated treatment well    Behavior During Therapy WFL for tasks assessed/performed           Past Medical History:  Diagnosis Date  . Thyroid disease     Past Surgical History:  Procedure Laterality Date  . TOTAL HIP ARTHROPLASTY      There were no vitals filed for this visit.   Subjective Assessment - 07/28/20 1023    Subjective COVID-19 screen performed prior to patient entering clinic. Patient reports that she has been having more knee pain recently. Sees MD on 08/04/2020.    Pertinent History OP, Thyroid problem, variose vein surgery, left THA (06/03/20...follow hip precautions), hysterectomy, breast cancer (2021).    How long can you walk comfortably? Short community distances with a FWW    Patient Stated Goals Greater ease in getting around.    Currently in Pain? Yes    Pain Score 2     Pain Location Knee    Pain Orientation Left    Pain Descriptors / Indicators Nagging;Discomfort    Pain Type Chronic pain    Pain Onset More than a month ago    Pain Frequency Intermittent              [facility] PT Assessment - 07/28/20 0001      Assessment   Medical Diagnosis Pain in left knee.    Referring Provider (PT) Ranee Gosselin MD    Next MD Visit 08/04/2020      Precautions   Precaution Comments Left hip precautions.  PAIN-FREE LEFT LE THER EX.       Restrictions   Weight Bearing Restrictions No      Observation/Other Assessments-Edema    Edema Circumferential      Circumferential Edema   Circumferential - Right 37.3 cm    Circumferential - Left  41.6 cm      ROM / Strength   AROM / PROM / Strength AROM      AROM   Overall AROM  Within functional limits for tasks performed    AROM Assessment Site Knee    Right/Left Knee Left    Left Knee Extension 0    Left Knee Flexion 115                         [facility] Adult PT Treatment/Exercise - 07/28/20 0001      Knee/Hip Exercises: Aerobic   Nustep L3 x12 min   stopped due to R knee pain     Knee/Hip Exercises: Standing   Heel Raises Both;2 sets;10 reps      Knee/Hip Exercises: Seated   Long Arc Quad AROM;Left;2 sets;10 reps    Clamshell with TheraBand --   AROM x20 reps   Hamstring Curl Strengthening;Left;2 sets;10 reps  Hamstring Limitations red theraband      Knee/Hip Exercises: Supine   Straight Leg Raises AROM;Left;10 reps      Knee/Hip Exercises: Sidelying   Clams AROM L hip clam x10 reps   limited by weakness     Modalities   Modalities Vasopneumatic      Vasopneumatic   Number Minutes Vasopneumatic  10 minutes    Vasopnuematic Location  Knee    Vasopneumatic Pressure Low    Vasopneumatic Temperature  34/edema and pain                       PT Long Term Goals - 07/06/20 1153      PT LONG TERM GOAL #1   Title Independent with a HEP.    Time 4    Period Weeks    Status Achieved      PT LONG TERM GOAL #2   Title Perform ADL's with left knee pain not > 2-3/10.    Time 4    Period Weeks    Status On-going      PT LONG TERM GOAL #3   Title Improve left LE strength to a solid 4+/5 to increasestability for fucntional tasks.    Time 4    Period Weeks    Status On-going                 Plan - 07/28/20 1029    Clinical Impression Statement Patient presented in clinic with reports of more L knee pain recently. Has  orthopedic appt next week but states that he may start gel injections per plan. Patient also noticing more lately that she cannot fully extend L knee and walks with L knee flexion in stance. Limited with both seated and SL hip clam due to weakness of L hip abductors and external rotators. Increased edema notable in L knee but L knee ROM WNL in supine. Normal vasopneumatic response noted following removal of the modality.    Personal Factors and Comorbidities Comorbidity 1;Comorbidity 2;Other    Comorbidities OP, Thyroid problem, variose vein surgery, left THA (06/03/20...follow hip precautions), hysterectomy, breast cancer (2021).    Examination-Activity Limitations Locomotion Level;Other    Examination-Participation Restrictions Other    Stability/Clinical Decision Making Stable/Uncomplicated    Rehab Potential Excellent    PT Frequency 2x / week    PT Duration 4 weeks    PT Treatment/Interventions ADLs/Self Care Home Management;Electrical Stimulation;Cryotherapy;Moist Heat;Ultrasound;Neuromuscular re-education;Therapeutic activities;Functional mobility training;Gait training;Patient/family education;Manual techniques;Passive range of motion;Vasopneumatic Device    PT Next Visit Plan Pain-free left LE ther ex.  Left hip precautions.  Modalites and STW/M as needed.    Consulted and Agree with Plan of Care Patient           Patient will benefit from skilled therapeutic intervention in order to improve the following deficits and impairments:  Abnormal gait,Pain,Decreased activity tolerance,Increased edema,Decreased strength,Decreased range of motion  Visit Diagnosis: Chronic pain of left knee  Muscle weakness (generalized)     Problem List There are no problems to display for this patient.   Marvell Fuller, PTA 07/28/20 10:43 AM   [facility] [address] [place], [place], [address] Phone: [phone]   Fax:  [phone]  Name: Christina Walter MRN: [id number] Date of Birth: [birth year]

## 2020-08-05 ENCOUNTER — Encounter: Payer: Self-pay | Admitting: Physical Therapy

## 2020-08-05 ENCOUNTER — Other Ambulatory Visit: Payer: Self-pay

## 2020-08-05 ENCOUNTER — Ambulatory Visit: Payer: Medicare Other | Admitting: Physical Therapy

## 2020-08-05 DIAGNOSIS — G8929 Other chronic pain: Secondary | ICD-10-CM

## 2020-08-05 DIAGNOSIS — M6281 Muscle weakness (generalized): Secondary | ICD-10-CM

## 2020-08-05 DIAGNOSIS — M25562 Pain in left knee: Secondary | ICD-10-CM | POA: Diagnosis not present

## 2020-08-05 NOTE — Therapy (Signed)
[facility] [address] [place], [place], [address] Phone: [phone]   Fax:  [phone]  Physical Therapy Treatment  Patient Details  Name: Christina Walter MRN: [id number] Date of Birth: 08-22-1934 Referring Provider (PT): Ranee Gosselin MD   Encounter Date: 08/05/2020   PT End of Session - 08/05/20 0954     Visit Number 8    Number of Visits 12    Date for PT Re-Evaluation 07/27/20    Authorization Type PROGRESS NOTE AT 10TH VISIT.  KX MODIFIER AFTER 15 VISITS.    PT Start Time [phone]    PT Stop Time 1029    PT Time Calculation (min) 40 min    Equipment Utilized During Treatment Other (comment)   Rollator   Activity Tolerance Patient tolerated treatment well    Behavior During Therapy WFL for tasks assessed/performed             Past Medical History:  Diagnosis Date   Thyroid disease     Past Surgical History:  Procedure Laterality Date   TOTAL HIP ARTHROPLASTY      There were no vitals filed for this visit.   Subjective Assessment - 08/05/20 0953     Subjective COVID-19 screen performed prior to patient entering clinic. Reports discomfort after long drive to [place] but tried to move around some. Dr. Darrelyn Hillock gave her a cortisone shot in L knee yesterday.    Pertinent History OP, Thyroid problem, variose vein surgery, left THA (06/03/20...follow hip precautions), hysterectomy, breast cancer (2021).    How long can you walk comfortably? Short community distances with a FWW    Patient Stated Goals Greater ease in getting around.    Currently in Pain? No/denies                [facility] PT Assessment - 08/05/20 0001       Assessment   Medical Diagnosis Pain in left knee.    Referring Provider (PT) Ranee Gosselin MD      Precautions   Precaution Comments Left hip precautions.  PAIN-FREE LEFT LE THER EX.                           [facility] Adult PT Treatment/Exercise - 08/05/20 0001       Knee/Hip  Exercises: Aerobic   Nustep L2 x15 min      Knee/Hip Exercises: Standing   Heel Raises Both;2 sets;10 reps    Heel Raises Limitations with glute squeeze    Hip Abduction AROM;Left;2 sets;10 reps;Knee straight    Functional Squat 5 reps   attempted but discomfort reported     Knee/Hip Exercises: Seated   Long Arc Quad AROM;Left;2 sets;10 reps    [employer] x20 reps      Modalities   Modalities Vasopneumatic      Vasopneumatic   Number Minutes Vasopneumatic  10 minutes    Vasopnuematic Location  Knee    Vasopneumatic Pressure Low    Vasopneumatic Temperature  34/edema                         PT Long Term Goals - 07/06/20 1153       PT LONG TERM GOAL #1   Title Independent with a HEP.    Time 4    Period Weeks    Status Achieved      PT LONG TERM GOAL #2   Title Perform ADL's with  left knee pain not > 2-3/10.    Time 4    Period Weeks    Status On-going      PT LONG TERM GOAL #3   Title Improve left LE strength to a solid 4+/5 to increasestability for fucntional tasks.    Time 4    Period Weeks    Status On-going                   Plan - 08/05/20 1142     Clinical Impression Statement Patient presented in clinic after a short trip with driving and receiving cortisone injection in L knee yesterday by Dr. Darrelyn Hillock. Patient guided through light strengthening especially of hip abductors and glutes to assist with hip/knee control. Patient unable to tolerate mini squats in // bars due to discomfort. Patient continues to fatigue with LAQ and short holds at end range. Continued edema of L knee notable. Normal vasopneumatic response noted following removal of the modality.    Personal Factors and Comorbidities Comorbidity 1;Comorbidity 2;Other    Comorbidities OP, Thyroid problem, variose vein surgery, left THA (06/03/20...follow hip precautions), hysterectomy, breast cancer (2021).    Examination-Activity Limitations Locomotion Level;Other     Examination-Participation Restrictions Other    Stability/Clinical Decision Making Stable/Uncomplicated    Rehab Potential Excellent    PT Frequency 2x / week    PT Duration 4 weeks    PT Treatment/Interventions ADLs/Self Care Home Management;Electrical Stimulation;Cryotherapy;Moist Heat;Ultrasound;Neuromuscular re-education;Therapeutic activities;Functional mobility training;Gait training;Patient/family education;Manual techniques;Passive range of motion;Vasopneumatic Device    PT Next Visit Plan Pain-free left LE ther ex.  Left hip precautions.  Modalites and STW/M as needed.    Consulted and Agree with Plan of Care Patient             Patient will benefit from skilled therapeutic intervention in order to improve the following deficits and impairments:  Abnormal gait, Pain, Decreased activity tolerance, Increased edema, Decreased strength, Decreased range of motion  Visit Diagnosis: Chronic pain of left knee  Muscle weakness (generalized)     Problem List There are no problems to display for this patient.   Marvell Fuller, PTA 08/05/2020, 11:56 AM  [facility] [address] [place], [place], [address] Phone: [phone]   Fax:  [phone]  Name: Christina Walter MRN: [id number] Date of Birth: [birth year]

## 2020-08-10 ENCOUNTER — Other Ambulatory Visit: Payer: Self-pay

## 2020-08-10 ENCOUNTER — Encounter: Payer: Self-pay | Admitting: Physical Therapy

## 2020-08-10 ENCOUNTER — Ambulatory Visit: Payer: Medicare Other | Admitting: Physical Therapy

## 2020-08-10 DIAGNOSIS — M25562 Pain in left knee: Secondary | ICD-10-CM

## 2020-08-10 DIAGNOSIS — G8929 Other chronic pain: Secondary | ICD-10-CM

## 2020-08-10 DIAGNOSIS — M6281 Muscle weakness (generalized): Secondary | ICD-10-CM

## 2020-08-10 NOTE — Therapy (Signed)
[facility] [address] [place], [place], [address] Phone: [phone]   Fax:  [phone]  Physical Therapy Treatment  Patient Details  Name: Christina Walter MRN: [id number] Date of Birth: [birth year] Referring Provider (PT): Ranee Gosselin MD   Encounter Date: 08/10/2020   PT End of Session - 08/10/20 1003     Visit Number 9    Number of Visits 12    Date for PT Re-Evaluation 07/27/20    Authorization Type PROGRESS NOTE AT 10TH VISIT.  KX MODIFIER AFTER 15 VISITS.    PT Start Time [phone]    PT Stop Time 1028    PT Time Calculation (min) 42 min    Equipment Utilized During Treatment Other (comment)   Rollator   Activity Tolerance Patient tolerated treatment well    Behavior During Therapy WFL for tasks assessed/performed             Past Medical History:  Diagnosis Date   Thyroid disease     Past Surgical History:  Procedure Laterality Date   TOTAL HIP ARTHROPLASTY      There were no vitals filed for this visit.   Subjective Assessment - 08/10/20 1002     Subjective COVID-19 screen performed prior to patient entering clinic. Reports that results from injection did not last as long this last time.    Pertinent History OP, Thyroid problem, variose vein surgery, left THA (06/03/20...follow hip precautions), hysterectomy, breast cancer (2021).    How long can you walk comfortably? Short community distances with a FWW    Patient Stated Goals Greater ease in getting around.    Currently in Pain? No/denies                               [facility] Adult PT Treatment/Exercise - 08/10/20 0001       Knee/Hip Exercises: Aerobic   Nustep L2 x16 min      Knee/Hip Exercises: Standing   Heel Raises Both;2 sets;10 reps    Heel Raises Limitations with glute squeeze    Hip Flexion AROM;Both;2 sets;10 reps;Knee bent    Terminal Knee Extension Strengthening;Left;10 reps;Theraband    Theraband Level (Terminal Knee  Extension) Level 1 (Yellow)    Hip Abduction AROM;Left;2 sets;10 reps;Knee straight    Other Standing Knee Exercises sidestepping // bars x5 rep      Knee/Hip Exercises: Seated   Long Arc Quad Strengthening;Left;2 sets;10 reps;Weights    Long Arc Quad Weight 2 lbs.    Hamstring Curl Strengthening;Left;2 sets;10 reps;Limitations    Hamstring Limitations yellow theraband    Sit to Sand 15 reps;without UE support   minimal UE support; requires eccentric assist from table for stand > sit                        PT Long Term Goals - 07/06/20 1153       PT LONG TERM GOAL #1   Title Independent with a HEP.    Time 4    Period Weeks    Status Achieved      PT LONG TERM GOAL #2   Title Perform ADL's with left knee pain not > 2-3/10.    Time 4    Period Weeks    Status On-going      PT LONG TERM GOAL #3   Title Improve left LE strength to a solid 4+/5 to increasestability for fucntional tasks.  Time 4    Period Weeks    Status On-going                   Plan - 08/10/20 1150     Clinical Impression Statement Patient presented in clinic with reports of more pain after the last injection and symptoms were not relieved as much as last injection. Patient also demonstrating continued hip weakness although patient cued and conscious of trying more hip ER. Patient reports walking small distances within her home without AD but uses rollator outside of the home. Patient demonstrating lack of sufficient quad strength due to eccentric assistance required with sit <> stands from the table. Greater sensation and muscle strain noted by patient with TKE.    Personal Factors and Comorbidities Comorbidity 1;Comorbidity 2;Other    Comorbidities OP, Thyroid problem, variose vein surgery, left THA (06/03/20...follow hip precautions), hysterectomy, breast cancer (2021).    Examination-Activity Limitations Locomotion Level;Other    Examination-Participation Restrictions Other     Stability/Clinical Decision Making Stable/Uncomplicated    Rehab Potential Excellent    PT Frequency 2x / week    PT Duration 4 weeks    PT Treatment/Interventions ADLs/Self Care Home Management;Electrical Stimulation;Cryotherapy;Moist Heat;Ultrasound;Neuromuscular re-education;Therapeutic activities;Functional mobility training;Gait training;Patient/family education;Manual techniques;Passive range of motion;Vasopneumatic Device    PT Next Visit Plan Pain-free left LE ther ex.  Left hip precautions.  Modalites and STW/M as needed.    Consulted and Agree with Plan of Care Patient             Patient will benefit from skilled therapeutic intervention in order to improve the following deficits and impairments:  Abnormal gait, Pain, Decreased activity tolerance, Increased edema, Decreased strength, Decreased range of motion  Visit Diagnosis: Chronic pain of left knee  Muscle weakness (generalized)     Problem List There are no problems to display for this patient.   Marvell Fuller, PTA 08/10/2020, 11:54 AM  [facility] [address] [place], [place], [address] Phone: [phone]   Fax:  [phone]  Name: Christina Walter MRN: [id number] Date of Birth: [birth year]

## 2020-08-12 ENCOUNTER — Other Ambulatory Visit: Payer: Self-pay

## 2020-08-12 ENCOUNTER — Ambulatory Visit: Payer: Medicare Other

## 2020-08-12 DIAGNOSIS — M25562 Pain in left knee: Secondary | ICD-10-CM

## 2020-08-12 DIAGNOSIS — G8929 Other chronic pain: Secondary | ICD-10-CM

## 2020-08-12 DIAGNOSIS — M6281 Muscle weakness (generalized): Secondary | ICD-10-CM

## 2020-08-12 NOTE — Therapy (Signed)
[facility] [address] [place], [place], [address] Phone: [phone]   Fax:  [phone]  Physical Therapy Treatment  Patient Details  Name: Christina Walter MRN: [id number] Date of Birth: 11-08-1934 Referring Provider (PT): Ranee Gosselin MD   Encounter Date: 08/12/2020   PT End of Session - 08/12/20 1000     Visit Number 10    Number of Visits 12    Date for PT Re-Evaluation 08/26/20    Authorization Type PROGRESS NOTE AT 10TH VISIT.  KX MODIFIER AFTER 15 VISITS.    PT Start Time 0945    Activity Tolerance Patient tolerated treatment well    Behavior During Therapy WFL for tasks assessed/performed             Past Medical History:  Diagnosis Date   Thyroid disease     Past Surgical History:  Procedure Laterality Date   TOTAL HIP ARTHROPLASTY      There were no vitals filed for this visit.   Subjective Assessment - 08/12/20 0951     Subjective COVID-19 screen performed prior to patient entering clinic. She reports that she has had some pain and weakness in the L knee that she still has troulble with that caused her to fall several days ago.    Pertinent History OP, Thyroid problem, variose vein surgery, left THA (06/03/20...follow hip precautions), hysterectomy, breast cancer (2021).    How long can you walk comfortably? Short community distances with a FWW    Patient Stated Goals Greater ease in getting around.    Currently in Pain? Yes    Pain Score 2     Pain Location Knee    Pain Orientation Left    Pain Descriptors / Indicators Aching;Pressure;Sore;Discomfort    Pain Type Chronic pain    Pain Onset More than a month ago    Pain Relieving Factors No tmuch of anything                  [facility] Adult PT Treatment/Exercise - 08/12/20 0945       Exercises   Exercises Knee/Hip;Ankle      Knee/Hip Exercises: Stretches   Active Hamstring Stretch 30 seconds      Knee/Hip Exercises: Aerobic   Nustep L3 10  mins      Knee/Hip Exercises: Standing   Rocker Board 4 minutes    Rocker Board Limitations provide a stretch    SLS with Vectors reaching for cones to tap with foot      Knee/Hip Exercises: Seated   Ball Squeeze x 20    Other Seated Knee/Hip Exercises heel raises with weight at ankles    Sit to [employer] 10 reps   with feet staggered     Knee/Hip Exercises: Sidelying   Clams Strength 3 x 10 bil  with hip neutral      Knee/Hip Exercises: Prone   Other Prone Exercises heel to bottom  quad stretch hold 15 sec      Electrical Stimulation   Electrical Stimulation Location Left medial knee.    Electrical Stimulation Action IFC as 80-150Hz     Electrical Stimulation Parameters 40% scan x 15 min    Electrical Stimulation Goals Edema;Pain                         PT Long Term Goals - 08/12/20 1001       PT LONG TERM GOAL #1   Title Independent with a HEP.  Status Achieved      PT LONG TERM GOAL #2   Title Perform ADL's with left knee pain not > 2-3/10.    Time 2    Period Weeks    Status On-going      PT LONG TERM GOAL #3   Title Improve left LE strength to a solid 4+/5 to increasestability for fucntional tasks.    Time 2    Period Weeks    Status On-going                    Patient will benefit from skilled therapeutic intervention in order to improve the following deficits and impairments:     Visit Diagnosis: Chronic pain of left knee  Muscle weakness (generalized)     Problem List There are no problems to display for this patient.   Levonne Spiller 08/12/2020, 10:34 AM  [facility] [address] [place], [place], [address] Phone: [phone]   Fax:  [phone]  Name: Christina Walter MRN: [id number] Date of Birth: 1934-11-09

## 2020-08-17 ENCOUNTER — Ambulatory Visit: Payer: Medicare Other | Admitting: *Deleted

## 2020-08-17 ENCOUNTER — Other Ambulatory Visit: Payer: Self-pay

## 2020-08-17 DIAGNOSIS — M6281 Muscle weakness (generalized): Secondary | ICD-10-CM

## 2020-08-17 DIAGNOSIS — G8929 Other chronic pain: Secondary | ICD-10-CM

## 2020-08-17 DIAGNOSIS — M25562 Pain in left knee: Secondary | ICD-10-CM | POA: Diagnosis not present

## 2020-08-17 NOTE — Therapy (Signed)
[facility] [address] [place], [place], [address] Phone: [phone]   Fax:  [phone]  Physical Therapy Treatment  Patient Details  Name: Christina Walter MRN: [id number] Date of Birth: 1934-04-26 Referring Provider (PT): Ranee Gosselin MD   Encounter Date: 08/17/2020   PT End of Session - 08/17/20 1121     Visit Number 11    Number of Visits 12    Date for PT Re-Evaluation 08/26/20    Authorization Type PROGRESS NOTE AT 10TH VISIT.  KX MODIFIER AFTER 15 VISITS.    PT Start Time 1115    PT Stop Time 1204    PT Time Calculation (min) 49 min             Past Medical History:  Diagnosis Date   Thyroid disease     Past Surgical History:  Procedure Laterality Date   TOTAL HIP ARTHROPLASTY      There were no vitals filed for this visit.   Subjective Assessment - 08/17/20 1120     Subjective COVID-19 screen performed prior to patient entering clinic. I will be getting injections in LT knee starting in July    Pertinent History OP, Thyroid problem, variose vein surgery, left THA (06/03/20...follow hip precautions), hysterectomy, breast cancer (2021).    How long can you walk comfortably? Short community distances with a FWW    Patient Stated Goals Greater ease in getting around.    Currently in Pain? Yes    Pain Score 3     Pain Location Knee    Pain Orientation Left    Pain Descriptors / Indicators Aching;Sore;Discomfort    Pain Type Chronic pain    Pain Onset More than a month ago    Pain Frequency Intermittent                               [facility] Adult PT Treatment/Exercise - 08/17/20 0001       Exercises   Exercises Knee/Hip;Ankle      Knee/Hip Exercises: Aerobic   Nustep L4  x 12 mins with focus on good jt alignment.      Knee/Hip Exercises: Standing   Heel Raises Both;2 sets;10 reps    Rocker Board --      Knee/Hip Exercises: Seated   Clamshell with TheraBand Yellow   3x10 with 5 sec  holds   Sit to Sand with UE support   4x10 with focus on glute activation and pushing up with hips to decrease knee pressure     Manual Therapy   Manual Therapy --   seated manual resisted ABD LT LE x 10 hold 10 secs                        PT Long Term Goals - 08/12/20 1001       PT LONG TERM GOAL #1   Title Independent with a HEP.    Status Achieved      PT LONG TERM GOAL #2   Title Perform ADL's with left knee pain not > 2-3/10.    Time 2    Period Weeks    Status On-going      PT LONG TERM GOAL #3   Title Improve left LE strength to a solid 4+/5 to increasestability for fucntional tasks.    Time 2    Period Weeks    Status On-going  Plan - 08/17/20 1455     Clinical Impression Statement Pt arrived today doing fair with LT knee and HIP. She reports that she will have injections in July for LT knee. Today focused on functional squating and glute activation for primary mover to decrease knee pressure/ pain and hip abduction/ ER control. Pt did well toward end of Rx with improved glute and hip awareness/activation    Personal Factors and Comorbidities Comorbidity 1;Comorbidity 2;Other    Comorbidities OP, Thyroid problem, variose vein surgery, left THA (06/03/20...follow hip precautions), hysterectomy, breast cancer (2021).    Examination-Activity Limitations Locomotion Level;Other    Rehab Potential Excellent    PT Frequency 2x / week    PT Duration 4 weeks    PT Treatment/Interventions ADLs/Self Care Home Management;Electrical Stimulation;Cryotherapy;Moist Heat;Ultrasound;Neuromuscular re-education;Therapeutic activities;Functional mobility training;Gait training;Patient/family education;Manual techniques;Passive range of motion;Vasopneumatic Device    PT Next Visit Plan Pain-free left LE ther ex.  Left hip precautions.  Modalites and STW/M as needed.             Patient will benefit from skilled therapeutic intervention in order  to improve the following deficits and impairments:  Abnormal gait, Pain, Decreased activity tolerance, Increased edema, Decreased strength, Decreased range of motion  Visit Diagnosis: Chronic pain of left knee  Muscle weakness (generalized)     Problem List There are no problems to display for this patient.   [name],CHRIS, PTA 08/17/2020, 3:01 PM  [facility] [address] [place], [place], [address] Phone: [phone]   Fax:  [phone]  Name: Christina Walter MRN: [id number] Date of Birth: 02-15-1935

## 2020-08-19 ENCOUNTER — Ambulatory Visit: Payer: Medicare Other | Admitting: Physical Therapy

## 2020-08-19 ENCOUNTER — Other Ambulatory Visit: Payer: Self-pay

## 2020-08-19 DIAGNOSIS — G8929 Other chronic pain: Secondary | ICD-10-CM

## 2020-08-19 DIAGNOSIS — M6281 Muscle weakness (generalized): Secondary | ICD-10-CM

## 2020-08-19 DIAGNOSIS — M25562 Pain in left knee: Secondary | ICD-10-CM | POA: Diagnosis not present

## 2020-08-19 NOTE — Therapy (Signed)
[facility] [place], [place], [address] Phone: [phone]   Fax:  [phone]  Physical Therapy Treatment  Patient Details  Name: Christina Walter MRN: [id number] Date of Birth: 10-28-1934 Referring Provider (PT): Latanya Maudlin MD   Encounter Date: 08/19/2020   PT End of Session - 08/19/20 0948     Visit Number 12    Number of Visits 12    Date for PT Re-Evaluation 08/26/20    Authorization Type PROGRESS NOTE AT 10TH VISIT.  KX MODIFIER AFTER 15 VISITS.    PT Start Time 0945    PT Stop Time 1028    PT Time Calculation (min) 43 min    Activity Tolerance Patient tolerated treatment well    Behavior During Therapy WFL for tasks assessed/performed             Past Medical History:  Diagnosis Date   Thyroid disease     Past Surgical History:  Procedure Laterality Date   TOTAL HIP ARTHROPLASTY      There were no vitals filed for this visit.   Subjective Assessment - 08/19/20 0947     Subjective COVID-19 screen performed prior to patient entering clinic. Patient arrived doing well today with minimal discomfort.    Pertinent History OP, Thyroid problem, variose vein surgery, left THA (06/03/20...follow hip precautions), hysterectomy, breast cancer (2021).    How long can you walk comfortably? Short community distances with a FWW    Patient Stated Goals Greater ease in getting around.    Currently in Pain? Yes    Pain Score 2     Pain Location Knee    Pain Orientation Left    Pain Descriptors / Indicators Discomfort    Pain Type Chronic pain    Pain Onset More than a month ago    Pain Frequency Intermittent    Aggravating Factors  prolong standing    Pain Relieving Factors rest                [facility] PT Assessment - 08/19/20 0001       ROM / Strength   AROM / PROM / Strength Strength      Strength   Strength Assessment Site Knee;Hip    Right/Left Hip Left    Left Hip Flexion 4/5    Left Hip  ABduction 4/5    Right/Left Knee Left    Left Knee Flexion 4-/5    Left Knee Extension 4/5                           [facility] Adult PT Treatment/Exercise - 08/19/20 0001       Knee/Hip Exercises: Aerobic   Nustep L4  x 15 mins with focus on good jt alignment.      Knee/Hip Exercises: Seated   Long Arc Quad Strengthening;Left;10 reps;Weights;3 sets    [employer] Weight 2 lbs.    Clamshell with TheraBand Yellow   left x30reps   Hamstring Curl Strengthening;Left;3 sets;10 reps    Hamstring Limitations yellow theraband    Sit to Sand 5 reps;without UE support;1 set   using glut squeeze activation and push up with hips     Knee/Hip Exercises: Supine   Bridges Strengthening;Both;20 reps   buttock squeeze at top   Straight Leg Raises Strengthening;Left;20 reps  PT Long Term Goals - 08/19/20 0959       PT LONG TERM GOAL #1   Title Independent with a HEP.    Time 4    Period Weeks    Status Achieved      PT LONG TERM GOAL #2   Title Perform ADL's with left knee pain not > 2-3/10.    Baseline 2-3/10 per reported with ADL's 08/19/20    Time 2    Period Weeks    Status Achieved      PT LONG TERM GOAL #3   Title Improve left LE strength to a solid 4+/5 to increasestability for fucntional tasks.    Time 2    Period Weeks    Status On-going                   Plan - 08/19/20 1017     Clinical Impression Statement Patient tolerated treatment well today. Patient has reported 2-3/10 pain at most with ADL's. Patient has improved left LE strength yet some ongoing deficts. Today focused on strength progression. Met LTG #2 today with remaining ongoing.    Personal Factors and Comorbidities Comorbidity 1;Comorbidity 2;Other    Comorbidities OP, Thyroid problem, variose vein surgery, left THA (06/03/20...follow hip precautions), hysterectomy, breast cancer (2021).    Examination-Activity Limitations Locomotion Level;Other     Examination-Participation Restrictions Other    Stability/Clinical Decision Making Stable/Uncomplicated    Rehab Potential Excellent    PT Frequency 2x / week    PT Duration 4 weeks    PT Treatment/Interventions ADLs/Self Care Home Management;Electrical Stimulation;Cryotherapy;Moist Heat;Ultrasound;Neuromuscular re-education;Therapeutic activities;Functional mobility training;Gait training;Patient/family education;Manual techniques;Passive range of motion;Vasopneumatic Device    PT Next Visit Plan Pain-free left LE ther ex esp for HS and hip abd.  Left hip precautions.  Modalites and STW/M as needed. recert sent    Consulted and Agree with Plan of Care Patient             Patient will benefit from skilled therapeutic intervention in order to improve the following deficits and impairments:  Abnormal gait, Pain, Decreased activity tolerance, Increased edema, Decreased strength, Decreased range of motion  Visit Diagnosis: Chronic pain of left knee  Muscle weakness (generalized)     Problem List There are no problems to display for this patient.  Ladean Raya, PTA 08/19/20 10:51 AM   [facility] [place], [place], [address] Phone: [phone]   Fax:  [phone]  Name: Christina Walter MRN: [id number] Date of Birth: 11-26-1934

## 2020-08-23 ENCOUNTER — Other Ambulatory Visit: Payer: Self-pay

## 2020-08-23 ENCOUNTER — Encounter: Payer: Self-pay | Admitting: Physical Therapy

## 2020-08-23 ENCOUNTER — Ambulatory Visit: Payer: Medicare Other | Admitting: Physical Therapy

## 2020-08-23 DIAGNOSIS — M6281 Muscle weakness (generalized): Secondary | ICD-10-CM

## 2020-08-23 DIAGNOSIS — M25562 Pain in left knee: Secondary | ICD-10-CM | POA: Diagnosis not present

## 2020-08-23 DIAGNOSIS — G8929 Other chronic pain: Secondary | ICD-10-CM

## 2020-08-23 NOTE — Therapy (Signed)
[facility] [address] [place], [place], [address] Phone: [phone]   Fax:  [phone]  Physical Therapy Treatment  Patient Details  Name: Christina Walter MRN: [id number] Date of Birth: 11-29-1934 Referring Provider (PT): Ranee Gosselin MD   Encounter Date: 08/23/2020   PT End of Session - 08/23/20 1005     Visit Number 13    Number of Visits 16    Date for PT Re-Evaluation 09/02/20    Authorization Type PROGRESS NOTE AT 10TH VISIT.  KX MODIFIER AFTER 15 VISITS.    PT Start Time [phone]    PT Stop Time 1032    PT Time Calculation (min) 44 min    Equipment Utilized During Treatment Other (comment)   rollator   Activity Tolerance Patient tolerated treatment well    Behavior During Therapy WFL for tasks assessed/performed             Past Medical History:  Diagnosis Date   Thyroid disease     Past Surgical History:  Procedure Laterality Date   TOTAL HIP ARTHROPLASTY      There were no vitals filed for this visit.   Subjective Assessment - 08/23/20 1005     Subjective COVID-19 screen performed prior to patient entering clinic. Patient reports no pain but tingling of L lower leg that started prior to Christmas 2021.    Pertinent History OP, Thyroid problem, variose vein surgery, left THA (06/03/20...follow hip precautions), hysterectomy, breast cancer (2021).    How long can you walk comfortably? Short community distances with a FWW    Patient Stated Goals Greater ease in getting around.    Currently in Pain? No/denies                [facility] PT Assessment - 08/23/20 0001       Assessment   Medical Diagnosis Pain in left knee.    Referring Provider (PT) Ranee Gosselin MD    Next MD Visit 09/05/2020      Precautions   Precaution Comments Left hip precautions.  PAIN-FREE LEFT LE THER EX.                           [facility] Adult PT Treatment/Exercise - 08/23/20 0001       Knee/Hip Exercises: Aerobic    Nustep L2 x15 min      Knee/Hip Exercises: Standing   Heel Raises Both;2 sets;10 reps    Heel Raises Limitations with glute squeeze    Knee Flexion --    Other Standing Knee Exercises B toe raise x20 reps      Knee/Hip Exercises: Seated   Long Arc Quad Strengthening;Left;2 sets;10 reps;Limitations    Long Arc Quad Limitations yellow theraband    Hamstring Curl Strengthening;Left;2 sets;10 reps;Limitations    Hamstring Limitations yellow theraband    Sit to Sand 20 reps;without UE support                         PT Long Term Goals - 08/19/20 0959       PT LONG TERM GOAL #1   Title Independent with a HEP.    Time 4    Period Weeks    Status Achieved      PT LONG TERM GOAL #2   Title Perform ADL's with left knee pain not > 2-3/10.    Baseline 2-3/10 per reported with ADL's 08/19/20    Time 2  Period Weeks    Status Achieved      PT LONG TERM GOAL #3   Title Improve left LE strength to a solid 4+/5 to increasestability for fucntional tasks.    Time 2    Period Weeks    Status On-going    Target Date 09/02/20                   Plan - 08/23/20 1114     Clinical Impression Statement Patient presented in clinic with continued L hip abductor weakness as well as L knee weakness. Patient progressed through more strengthening with focus of glute squeeze. Patient uses rollator for comfort due to fear of falling. Patient able to complete sit <> stands without UE support. More cues required to equalize WBing with sit <> stands as patient leans more to LLE.    Personal Factors and Comorbidities Comorbidity 1;Comorbidity 2;Other    Comorbidities OP, Thyroid problem, variose vein surgery, left THA (06/03/20...follow hip precautions), hysterectomy, breast cancer (2021).    Examination-Activity Limitations Locomotion Level;Other    Examination-Participation Restrictions Other    Stability/Clinical Decision Making Stable/Uncomplicated    Rehab Potential  Excellent    PT Frequency 2x / week    PT Duration 2 weeks    PT Treatment/Interventions ADLs/Self Care Home Management;Electrical Stimulation;Cryotherapy;Moist Heat;Ultrasound;Neuromuscular re-education;Therapeutic activities;Functional mobility training;Gait training;Patient/family education;Manual techniques;Passive range of motion;Vasopneumatic Device    PT Next Visit Plan Pain-free left LE ther ex esp for HS and hip abd.  Left hip precautions.  Modalites and STW/M as needed. recert sent    Consulted and Agree with Plan of Care Patient             Patient will benefit from skilled therapeutic intervention in order to improve the following deficits and impairments:  Abnormal gait, Pain, Decreased activity tolerance, Increased edema, Decreased strength, Decreased range of motion  Visit Diagnosis: Chronic pain of left knee  Muscle weakness (generalized)     Problem List There are no problems to display for this patient.   Marvell Fuller, PTA 08/23/2020, 11:26 AM  [facility] [address] [place], [place], [address] Phone: [phone]   Fax:  [phone]  Name: Christina Walter MRN: [id number] Date of Birth: 03/05/1934

## 2020-08-26 ENCOUNTER — Ambulatory Visit: Payer: Medicare Other | Attending: Orthopedic Surgery | Admitting: Physical Therapy

## 2020-08-26 ENCOUNTER — Encounter: Payer: Self-pay | Admitting: Physical Therapy

## 2020-08-26 ENCOUNTER — Other Ambulatory Visit: Payer: Self-pay

## 2020-08-26 DIAGNOSIS — M6281 Muscle weakness (generalized): Secondary | ICD-10-CM | POA: Diagnosis present

## 2020-08-26 DIAGNOSIS — G8929 Other chronic pain: Secondary | ICD-10-CM

## 2020-08-26 DIAGNOSIS — M25562 Pain in left knee: Secondary | ICD-10-CM | POA: Insufficient documentation

## 2020-08-26 NOTE — Therapy (Signed)
[facility] [address] [place], [place], [address] Phone: [phone]   Fax:  [phone]  Physical Therapy Treatment  Patient Details  Name: Christina Walter MRN: [id number] Date of Birth: Aug 09, 1934 Referring Provider (PT): Ranee Gosselin MD   Encounter Date: 08/26/2020   PT End of Session - 08/26/20 0958     Visit Number 14    Number of Visits 16    Date for PT Re-Evaluation 09/02/20    Authorization Type PROGRESS NOTE AT 10TH VISIT.  KX MODIFIER AFTER 15 VISITS.    PT Start Time [phone]    PT Stop Time 1029    PT Time Calculation (min) 43 min    Equipment Utilized During Treatment Other (comment)   Rollator   Activity Tolerance Patient tolerated treatment well    Behavior During Therapy WFL for tasks assessed/performed             Past Medical History:  Diagnosis Date   Thyroid disease     Past Surgical History:  Procedure Laterality Date   TOTAL HIP ARTHROPLASTY      There were no vitals filed for this visit.   Subjective Assessment - 08/26/20 0951     Subjective COVID-19 screen performed prior to patient entering clinic. Some L knee discomfort.    Pertinent History OP, Thyroid problem, variose vein surgery, left THA (06/03/20...follow hip precautions), hysterectomy, breast cancer (2021).    How long can you walk comfortably? Short community distances with a FWW    Patient Stated Goals Greater ease in getting around.    Currently in Pain? Yes    Pain Score 2     Pain Location Knee    Pain Orientation Left    Pain Descriptors / Indicators Discomfort    Pain Type Chronic pain    Pain Onset More than a month ago    Pain Frequency Intermittent                [facility] PT Assessment - 08/26/20 0001       Assessment   Medical Diagnosis Pain in left knee.    Referring Provider (PT) Ranee Gosselin MD    Next MD Visit 09/05/2020      Precautions   Precaution Comments Left hip precautions.  PAIN-FREE LEFT LE THER  EX.                           [facility] Adult PT Treatment/Exercise - 08/26/20 0001       Knee/Hip Exercises: Aerobic   Nustep L2 x15 min      Knee/Hip Exercises: Standing   Knee Flexion AROM;Both;2 sets;10 reps    Hip Flexion AROM;Both;2 sets;10 reps;Knee bent    Hip Abduction AROM;Both;2 sets;10 reps;Knee straight    Step Down Left;2 sets;10 reps;Hand Hold: 2;Step Height: 4"      Knee/Hip Exercises: Seated   Long Arc Quad Strengthening;Left;2 sets;10 reps;Limitations    Long Arc Quad Weight 3 lbs.      Knee/Hip Exercises: Supine   Bridges Strengthening;Both;2 sets;10 reps    Straight Leg Raises AROM;Left;2 sets;10 reps    Other Supine Knee/Hip Exercises L hip abduction with min SLR x20 reps                         PT Long Term Goals - 08/19/20 0959       PT LONG TERM GOAL #1   Title Independent with  a HEP.    Time 4    Period Weeks    Status Achieved      PT LONG TERM GOAL #2   Title Perform ADL's with left knee pain not > 2-3/10.    Baseline 2-3/10 per reported with ADL's 08/19/20    Time 2    Period Weeks    Status Achieved      PT LONG TERM GOAL #3   Title Improve left LE strength to a solid 4+/5 to increasestability for fucntional tasks.    Time 2    Period Weeks    Status On-going    Target Date 09/02/20                   Plan - 08/26/20 1218     Clinical Impression Statement Patient presented in clinic with reports of minimal L knee pain. Patient able to tolerate more hip focused strengthening with only minimal reports of pain. Weakness still noted in quad and hip abductors. Patient instructed to complete more glute squeeze. More quality control instructed with LAQ and SLR.    Personal Factors and Comorbidities Comorbidity 1;Comorbidity 2;Other    Comorbidities OP, Thyroid problem, variose vein surgery, left THA (06/03/20...follow hip precautions), hysterectomy, breast cancer (2021).    Examination-Activity Limitations  Locomotion Level;Other    Examination-Participation Restrictions Other    Stability/Clinical Decision Making Stable/Uncomplicated    Rehab Potential Excellent    PT Frequency 2x / week    PT Duration 2 weeks    PT Treatment/Interventions ADLs/Self Care Home Management;Electrical Stimulation;Cryotherapy;Moist Heat;Ultrasound;Neuromuscular re-education;Therapeutic activities;Functional mobility training;Gait training;Patient/family education;Manual techniques;Passive range of motion;Vasopneumatic Device    PT Next Visit Plan Pain-free left LE ther ex esp for HS and hip abd.  Left hip precautions.  Modalites and STW/M as needed. recert sent    Consulted and Agree with Plan of Care Patient             Patient will benefit from skilled therapeutic intervention in order to improve the following deficits and impairments:  Abnormal gait, Pain, Decreased activity tolerance, Increased edema, Decreased strength, Decreased range of motion  Visit Diagnosis: Chronic pain of left knee  Muscle weakness (generalized)     Problem List There are no problems to display for this patient.   Marvell Fuller, PTA 08/26/2020, 12:26 PM  [facility] [address] [place], [place], [address] Phone: [phone]   Fax:  [phone]  Name: Christina Walter MRN: [id number] Date of Birth: 1934/08/27

## 2020-08-31 ENCOUNTER — Ambulatory Visit: Payer: Medicare Other | Admitting: Physical Therapy

## 2020-08-31 ENCOUNTER — Other Ambulatory Visit: Payer: Self-pay

## 2020-08-31 ENCOUNTER — Encounter: Payer: Self-pay | Admitting: Physical Therapy

## 2020-08-31 DIAGNOSIS — M25562 Pain in left knee: Secondary | ICD-10-CM | POA: Diagnosis not present

## 2020-08-31 DIAGNOSIS — G8929 Other chronic pain: Secondary | ICD-10-CM

## 2020-08-31 DIAGNOSIS — M6281 Muscle weakness (generalized): Secondary | ICD-10-CM

## 2020-08-31 NOTE — Therapy (Signed)
[facility] [place], [place], [address] Phone: [phone]   Fax:  [phone]  Physical Therapy Treatment  Patient Details  Name: Christina Walter MRN: [id number] Date of Birth: [birth year] Referring Provider (PT): Latanya Maudlin MD   Encounter Date: 08/31/2020   PT End of Session - 08/31/20 1002     Visit Number 15    Number of Visits 16    Date for PT Re-Evaluation 09/02/20    Authorization Type PROGRESS NOTE AT 10TH VISIT.  KX MODIFIER AFTER 15 VISITS.    PT Start Time [phone]    PT Stop Time 1032    PT Time Calculation (min) 44 min    Equipment Utilized During Treatment Other (comment)   Rollator   Activity Tolerance Patient tolerated treatment well    Behavior During Therapy WFL for tasks assessed/performed             Past Medical History:  Diagnosis Date   Thyroid disease     Past Surgical History:  Procedure Laterality Date   TOTAL HIP ARTHROPLASTY      There were no vitals filed for this visit.   Subjective Assessment - 08/31/20 0949     Subjective COVID-19 screen performed prior to patient entering clinic. Denies any pain.    Pertinent History OP, Thyroid problem, variose vein surgery, left THA (06/03/20...follow hip precautions), hysterectomy, breast cancer (2021).    How long can you walk comfortably? Short community distances with a FWW    Patient Stated Goals Greater ease in getting around.    Currently in Pain? No/denies                [facility] PT Assessment - 08/31/20 0001       Assessment   Medical Diagnosis Pain in left knee.    Referring Provider (PT) Latanya Maudlin MD    Next MD Visit 09/05/2020   Begin gel injectins     Precautions   Precaution Comments Left hip precautions.  PAIN-FREE LEFT LE THER EX.      ROM / Strength   AROM / PROM / Strength Strength      Strength   Overall Strength Deficits    Strength Assessment Site Hip;Knee    Right/Left Hip Left    Left Hip Flexion  3+/5    Right/Left Knee Left    Left Knee Flexion 4-/5    Left Knee Extension 4-/5                           [facility] Adult PT Treatment/Exercise - 08/31/20 0001       Knee/Hip Exercises: Aerobic   Nustep L3 x15 min      Knee/Hip Exercises: Seated   Long Arc Quad Strengthening;Left;2 sets;10 reps;Limitations    Long Arc Quad Weight 2 lbs.    Ball Squeeze x 20    Hamstring Curl Strengthening;Left;2 sets;10 reps;Limitations    Hamstring Limitations yellow theraband      Knee/Hip Exercises: Supine   Bridges Strengthening;Both;2 sets;10 reps    Straight Leg Raises AROM;Left;2 sets;10 reps    Other Supine Knee/Hip Exercises B hip clam yellow theraband x20 reps; B hip marching x20 reps    Other Supine Knee/Hip Exercises B hip abduction with SLR x15 reps                         PT Long Term Goals - 08/31/20 1037  PT LONG TERM GOAL #1   Title Independent with a HEP.    Time 4    Period Weeks    Status Achieved      PT LONG TERM GOAL #2   Title Perform ADL's with left knee pain not > 2-3/10.    Baseline 2-3/10 per reported with ADL's 08/19/20    Time 2    Period Weeks    Status Achieved      PT LONG TERM GOAL #3   Title Improve left LE strength to a solid 4+/5 to increasestability for fucntional tasks.    Time 2    Period Weeks    Status Not Met                   Plan - 08/31/20 1038     Clinical Impression Statement Patient presented in clinic with no complaints of pain. Patient able to achieve all goals made at evaluation except for LLE MMT goal. Patient compliant with exercise but D/C today secondary to beginning gel injections on 09/05/2020. Patient's LEs still weak with hip IR noted during any therex due to hip weakness. Patient compliant with HEP.    Personal Factors and Comorbidities Comorbidity 1;Comorbidity 2;Other    Comorbidities OP, Thyroid problem, variose vein surgery, left THA (06/03/20...follow hip precautions),  hysterectomy, breast cancer (2021).    Examination-Activity Limitations Locomotion Level;Other    Examination-Participation Restrictions Other    Stability/Clinical Decision Making Stable/Uncomplicated    Rehab Potential Excellent    PT Frequency 2x / week    PT Duration 2 weeks    PT Treatment/Interventions ADLs/Self Care Home Management;Electrical Stimulation;Cryotherapy;Moist Heat;Ultrasound;Neuromuscular re-education;Therapeutic activities;Functional mobility training;Gait training;Patient/family education;Manual techniques;Passive range of motion;Vasopneumatic Device    PT Next Visit Plan D/C    Consulted and Agree with Plan of Care Patient             Patient will benefit from skilled therapeutic intervention in order to improve the following deficits and impairments:  Abnormal gait, Pain, Decreased activity tolerance, Increased edema, Decreased strength, Decreased range of motion  Visit Diagnosis: Chronic pain of left knee  Muscle weakness (generalized)     Problem List There are no problems to display for this patient.   Standley Brooking, PTA 08/31/20 12:31 PM   [facility] [address] [place], [place], [address] Phone: [phone]   Fax:  [phone]  Name: Christina Walter MRN: [id number] Date of Birth: Apr 12, 1934   PHYSICAL THERAPY DISCHARGE SUMMARY  Visits from Start of Care: 15.  Current functional level related to goals / functional outcomes: See above.    Remaining deficits: See goal section.   Education / Equipment: HEP.   Patient agrees to discharge. Patient goals were partially met. Patient is being discharged due to being pleased with the current functional level.    Mali Applegate MPT

## 2020-09-27 ENCOUNTER — Other Ambulatory Visit: Payer: Self-pay

## 2020-09-27 ENCOUNTER — Encounter (HOSPITAL_COMMUNITY): Payer: Self-pay | Admitting: Emergency Medicine

## 2020-09-27 ENCOUNTER — Emergency Department (HOSPITAL_COMMUNITY)
Admission: EM | Admit: 2020-09-27 | Discharge: 2020-09-28 | Disposition: A | Discharge status: Home / Self Care | Payer: Medicare Other | Attending: Emergency Medicine | Admitting: Emergency Medicine

## 2020-09-27 DIAGNOSIS — K623 Rectal prolapse: Secondary | ICD-10-CM | POA: Diagnosis not present

## 2020-09-27 DIAGNOSIS — R109 Unspecified abdominal pain: Secondary | ICD-10-CM | POA: Diagnosis present

## 2020-09-27 DIAGNOSIS — Z96649 Presence of unspecified artificial hip joint: Secondary | ICD-10-CM | POA: Diagnosis not present

## 2020-09-27 HISTORY — DX: Cystocele, unspecified: N81.10

## 2020-09-27 NOTE — ED Triage Notes (Signed)
Pt brought in by [facility] tonight after straining while going to bathroom. States she has a known bladder prolapse but felt different tissue when she was wiping closer to her rectum and that she initially thought iit might be a hemorrhoid but had her granddaughter take a look and granddaughter stated she saw "internal tissue coming from rectum".

## 2020-09-27 NOTE — ED Provider Notes (Signed)
[facility] EMERGENCY DEPARTMENT Provider Note   CSN: [id number] Arrival date & time: 09/27/20  2244     History Chief Complaint  Patient presents with   ? Rectal Polapse    Christina Walter is a 86 y.o. female.  Patient with a history of bladder prolapse here with what she thinks is rectal prolapse.  States intermittent diarrhea for the past several days.  Today she had only 2 loose stools but when she had a bowel movement tonight she felt pressure and abnormal tissue outside of her rectum with wiping.  Denies any bleeding or black or bloody stools.  This is never happened to her rectum before.  She feels like her rectum is "inside out".  Has mild discomfort.  No abdominal pain, chest pain, shortness of breath.  No fevers, chills, nausea or vomiting. Is not having any issues with her bladder prolapse currently  The history is provided by the patient.      Past Medical History:  Diagnosis Date   Female bladder prolapse    Thyroid disease     There are no problems to display for this patient.   Past Surgical History:  Procedure Laterality Date   TOTAL HIP ARTHROPLASTY       OB History   No obstetric history on file.     History reviewed. No pertinent family history.  Social History   Tobacco Use   Smoking status: Never   Smokeless tobacco: Never  Substance Use Topics   Alcohol use: Never   Drug use: Never    Home Medications Prior to Admission medications   Medication Sig Start Date End Date Taking? Authorizing Provider  azithromycin (ZITHROMAX) 250 MG tablet Take 1 tablet (250 mg total) by mouth daily. Take first 2 tablets together, then 1 every day until finished. 07/13/20   Henderly, Britni A, PA-C  benzonatate (TESSALON) 100 MG capsule Take 1 capsule (100 mg total) by mouth every 8 (eight) hours. 07/13/20   Henderly, Britni A, PA-C  Thyroid (LEVOTHYROXINE-LIOTHYRONINE PO) Take by mouth.    [provider]    Allergies    Patient has no known  allergies.  Review of Systems   Review of Systems  Constitutional:  Negative for activity change, appetite change and fever.  HENT:  Negative for congestion.   Eyes:  Negative for visual disturbance.  Respiratory:  Negative for cough, chest tightness and shortness of breath.   Cardiovascular:  Negative for chest pain.  Gastrointestinal:  Negative for abdominal pain, nausea and vomiting.  Genitourinary:  Negative for dysuria and hematuria.  Musculoskeletal:  Negative for arthralgias and myalgias.  Skin:  Negative for rash.  Neurological:  Negative for dizziness, weakness and headaches.   all other systems are negative except as noted in the HPI and PMH.   Physical Exam Updated Vital Signs BP 136/80   Pulse 84   Temp 98.4 F (36.9 C)   Resp 18   Ht 5\' 3"  (1.6 m)   Wt 50.3 kg   SpO2 97%   BMI 19.66 kg/m   Physical Exam Vitals and nursing note reviewed.  Constitutional:      General: She is not in acute distress.    Appearance: She is well-developed.  HENT:     Head: Normocephalic and atraumatic.     Mouth/Throat:     Pharynx: No oropharyngeal exudate.  Eyes:     Conjunctiva/sclera: Conjunctivae normal.     Pupils: Pupils are equal, round, and reactive to light.  Neck:     Comments: No meningismus. Cardiovascular:     Rate and Rhythm: Normal rate and regular rhythm.     Heart sounds: Normal heart sounds. No murmur heard. Pulmonary:     Effort: Pulmonary effort is normal. No respiratory distress.     Breath sounds: Normal breath sounds.  Abdominal:     Palpations: Abdomen is soft.     Tenderness: There is no abdominal tenderness. There is no guarding or rebound.  Genitourinary:    Comments: Kristy RN present as chaperone.   Rectal prolapse reduced with gentle pressure.  No gross blood Musculoskeletal:        General: No tenderness. Normal range of motion.     Cervical back: Normal range of motion and neck supple.  Skin:    General: Skin is warm.  Neurological:      Mental Status: She is alert and oriented to person, place, and time.     Cranial Nerves: No cranial nerve deficit.     Motor: No abnormal muscle tone.     Coordination: Coordination normal.     Comments:  5/5 strength throughout. CN 2-12 intact.Equal grip strength.   Psychiatric:        Behavior: Behavior normal.    ED Results / Procedures / Treatments   Labs (all labs ordered are listed, but only abnormal results are displayed) Labs Reviewed  COMPREHENSIVE METABOLIC PANEL - Abnormal; Notable for the following components:      Result Value   Glucose, Bld 111 (*)    BUN 24 (*)    All other components within normal limits  CBC WITH DIFFERENTIAL/PLATELET    EKG None  Radiology CT ABDOMEN PELVIS W CONTRAST  Result Date: 09/28/2020 CLINICAL DATA:  Diarrhea, abdominal pain, acute, nonlocalized rectal prolapse. EXAM: CT ABDOMEN AND PELVIS WITH CONTRAST TECHNIQUE: Multidetector CT imaging of the abdomen and pelvis was performed using the standard protocol following bolus administration of intravenous contrast. CONTRAST:  69mL OMNIPAQUE IOHEXOL 300 MG/ML  SOLN COMPARISON:  None. FINDINGS: Lower chest: Mild bibasilar pulmonary fibrotic change. Mild cylindrical bronchiectasis with scattered foci of airway impaction. No superimposed confluent pulmonary infiltrate. Cardiac size within normal limits. Hepatobiliary: Gallbladder unremarkable. Liver unremarkable. No significant intra or extrahepatic biliary ductal dilation. Pancreas: Unremarkable Spleen: Unremarkable Adrenals/Urinary Tract: The adrenal glands are unremarkable. The kidneys are normal in size and position. Tiny cortical cysts are seen within the a mid and lower pole of the right kidney. The kidneys are otherwise unremarkable. The bladder is partially obscured by streak artifact from left total hip arthroplasty, however, the visualize component is unremarkable peer Stomach/Bowel: The stomach, small bowel, and large bowel are unremarkable.  The appendix is not clearly identified, however, there are no secondary signs of appendicitis within the right lower quadrant of the abdomen. No free intraperitoneal gas or fluid. Vascular/Lymphatic: Moderate aortoiliac atherosclerotic calcification. No aortic aneurysm. No pathologic adenopathy within the abdomen and pelvis. Reproductive: Status post hysterectomy. No adnexal masses. Other: Numerous phleboliths are seen within the pelvis. No abdominal wall hernia. The rectum is unremarkable. Musculoskeletal: Left total hip arthroplasty has been performed. Osseous structures are diffusely osteopenic. No acute bone abnormality. IMPRESSION: No acute intra-abdominal pathology identified. Noted if note radiographic explanation for the patient's reported symptoms. No evidence of rectal prolapse. Mild bibasilar bronchiectasis with scattered foci of airway impaction, possibly infectious or inflammatory. Aortic Atherosclerosis (ICD10-I70.0). Electronically Signed   By: Helyn Numbers MD   On: 09/28/2020 02:48    Procedures Hernia reduction  Date/Time: 09/28/2020 2:53 AM Performed by: Glynn Octave, MD Authorized by: Glynn Octave, MD  Consent: Verbal consent obtained. Consent given by: patient Patient understanding: patient states understanding of the procedure being performed Patient consent: the patient's understanding of the procedure matches consent given Patient identity confirmed: verbally with patient Time out: Immediately prior to procedure a "time out" was called to verify the correct patient, procedure, equipment, support staff and site/side marked as required. Local anesthesia used: no  Anesthesia: Local anesthesia used: no  Sedation: Patient sedated: no  Patient tolerance: patient tolerated the procedure well with no immediate complications Comments: Reduction of rectal prolapse with gentle steady pressure.     Medications Ordered in ED Medications - No data to display  ED Course   I have reviewed the triage vital signs and the nursing notes.  Pertinent labs & imaging results that were available during my care of the patient were reviewed by me and considered in my medical decision making (see chart for details).    MDM Rules/Calculators/A&P                          Concern for possible rectal prolapse.  No abdominal pain. Patient currently in the hallway and will need to be moved to a room for further assessment  Rectal prolapse was reduced with gentle pressure with Engineer, manufacturing.  Patient tolerated well. Labs reassuring.  Given history of ongoing diarrhea for several days, CT scan was obtained to rule out infectious colitis.  This was normal.  Patient improved on recheck and tolerating p.o.  Advised her to follow-up with her PCP as well as general surgeon.  Advised starting a stool softener such as MiraLAX or Colace after her diarrhea resolves and she is back on her regular schedule.  Avoid straining.  Return precautions discussed Final Clinical Impression(s) / ED Diagnoses Final diagnoses:  Rectal prolapse    Rx / DC Orders ED Discharge Orders     None        Rhona Fusilier, Jeannett Senior, MD 09/28/20 [phone]

## 2020-09-28 ENCOUNTER — Emergency Department (HOSPITAL_COMMUNITY): Payer: Medicare Other

## 2020-09-28 DIAGNOSIS — K623 Rectal prolapse: Secondary | ICD-10-CM | POA: Diagnosis not present

## 2020-09-28 LAB — CBC WITH DIFFERENTIAL/PLATELET
Abs Immature Granulocytes: 0.01 10*3/uL (ref 0.00–0.07)
Basophils Absolute: 0.1 10*3/uL (ref 0.0–0.1)
Basophils Relative: 1 %
Eosinophils Absolute: 0.1 10*3/uL (ref 0.0–0.5)
Eosinophils Relative: 1 %
HCT: 41.6 % (ref 36.0–46.0)
Hemoglobin: 13.4 g/dL (ref 12.0–15.0)
Immature Granulocytes: 0 %
Lymphocytes Relative: 19 %
Lymphs Abs: 1.4 10*3/uL (ref 0.7–4.0)
MCH: 30.2 pg (ref 26.0–34.0)
MCHC: 32.2 g/dL (ref 30.0–36.0)
MCV: 93.7 fL (ref 80.0–100.0)
Monocytes Absolute: 0.7 10*3/uL (ref 0.1–1.0)
Monocytes Relative: 9 %
Neutro Abs: 5 10*3/uL (ref 1.7–7.7)
Neutrophils Relative %: 70 %
Platelets: 223 10*3/uL (ref 150–400)
RBC: 4.44 MIL/uL (ref 3.87–5.11)
RDW: 15.3 % (ref 11.5–15.5)
WBC: 7.1 10*3/uL (ref 4.0–10.5)
nRBC: 0 % (ref 0.0–0.2)

## 2020-09-28 LAB — COMPREHENSIVE METABOLIC PANEL
ALT: 12 U/L (ref 0–44)
AST: 22 U/L (ref 15–41)
Albumin: 4.1 g/dL (ref 3.5–5.0)
Alkaline Phosphatase: 93 U/L (ref 38–126)
Anion gap: 9 (ref 5–15)
BUN: 24 mg/dL — ABNORMAL HIGH (ref 8–23)
CO2: 27 mmol/L (ref 22–32)
Calcium: 9.4 mg/dL (ref 8.9–10.3)
Chloride: 106 mmol/L (ref 98–111)
Creatinine, Ser: 0.69 mg/dL (ref 0.44–1.00)
GFR, Estimated: >60 mL/min (ref >60)
Glucose, Bld: 111 mg/dL — ABNORMAL HIGH (ref 70–99)
Potassium: 4 mmol/L (ref 3.5–5.1)
Sodium: 142 mmol/L (ref 135–145)
Total Bilirubin: 0.6 mg/dL (ref 0.3–1.2)
Total Protein: 6.9 g/dL (ref 6.5–8.1)

## 2020-09-28 MED ORDER — IOHEXOL 300 MG/ML  SOLN
75.0000 mL | Freq: Once | INTRAMUSCULAR | Status: AC | PRN
  Administered 2020-09-28: 75 mL via INTRAVENOUS

## 2020-09-28 NOTE — Discharge Instructions (Addendum)
After your diarrhea has resolved you may take a stool softener on a daily basis such as Colace or MiraLAX.  Follow-up with your doctor.  Return to the ED with new or worsening symptoms.

## 2020-10-03 ENCOUNTER — Other Ambulatory Visit: Payer: Self-pay

## 2020-10-03 ENCOUNTER — Emergency Department (HOSPITAL_COMMUNITY)
Admission: EM | Admit: 2020-10-03 | Discharge: 2020-10-04 | Disposition: A | Discharge status: Home / Self Care | Payer: Medicare Other | Attending: Emergency Medicine | Admitting: Emergency Medicine

## 2020-10-03 ENCOUNTER — Encounter (HOSPITAL_COMMUNITY): Payer: Self-pay | Admitting: *Deleted

## 2020-10-03 DIAGNOSIS — Z96649 Presence of unspecified artificial hip joint: Secondary | ICD-10-CM | POA: Diagnosis not present

## 2020-10-03 DIAGNOSIS — K623 Rectal prolapse: Secondary | ICD-10-CM | POA: Diagnosis present

## 2020-10-03 LAB — CBC WITH DIFFERENTIAL/PLATELET
Abs Immature Granulocytes: 0.01 10*3/uL (ref 0.00–0.07)
Basophils Absolute: 0.1 10*3/uL (ref 0.0–0.1)
Basophils Relative: 1 %
Eosinophils Absolute: 0.1 10*3/uL (ref 0.0–0.5)
Eosinophils Relative: 2 %
HCT: 38.5 % (ref 36.0–46.0)
Hemoglobin: 12.3 g/dL (ref 12.0–15.0)
Immature Granulocytes: 0 %
Lymphocytes Relative: 24 %
Lymphs Abs: 1.4 10*3/uL (ref 0.7–4.0)
MCH: 29.4 pg (ref 26.0–34.0)
MCHC: 31.9 g/dL (ref 30.0–36.0)
MCV: 92.1 fL (ref 80.0–100.0)
Monocytes Absolute: 0.6 10*3/uL (ref 0.1–1.0)
Monocytes Relative: 10 %
Neutro Abs: 3.7 10*3/uL (ref 1.7–7.7)
Neutrophils Relative %: 63 %
Platelets: 240 10*3/uL (ref 150–400)
RBC: 4.18 MIL/uL (ref 3.87–5.11)
RDW: 15.3 % (ref 11.5–15.5)
WBC: 5.8 10*3/uL (ref 4.0–10.5)
nRBC: 0 % (ref 0.0–0.2)

## 2020-10-03 LAB — COMPREHENSIVE METABOLIC PANEL
ALT: 12 U/L (ref 0–44)
AST: 21 U/L (ref 15–41)
Albumin: 4.1 g/dL (ref 3.5–5.0)
Alkaline Phosphatase: 76 U/L (ref 38–126)
Anion gap: 7 (ref 5–15)
BUN: 20 mg/dL (ref 8–23)
CO2: 30 mmol/L (ref 22–32)
Calcium: 9.3 mg/dL (ref 8.9–10.3)
Chloride: 103 mmol/L (ref 98–111)
Creatinine, Ser: 0.75 mg/dL (ref 0.44–1.00)
GFR, Estimated: >60 mL/min (ref >60)
Glucose, Bld: 103 mg/dL — ABNORMAL HIGH (ref 70–99)
Potassium: 3.9 mmol/L (ref 3.5–5.1)
Sodium: 140 mmol/L (ref 135–145)
Total Bilirubin: 0.6 mg/dL (ref 0.3–1.2)
Total Protein: 6.9 g/dL (ref 6.5–8.1)

## 2020-10-03 NOTE — ED Triage Notes (Signed)
Pt with diarrhea x1wk and was seen at [facility] for a rectal prolapse. She was discharge and told her see a surgeon however wasn't given a correct number for a surgeon and when they contacted [facility], there was no appointment available until 8/30. Pt states that her rectum has prolapsed again today and wants to have a surgeon on call see her.

## 2020-10-03 NOTE — ED Provider Notes (Signed)
[facility]-EMERGENCY DEPT [facility] Emergency Department Provider Note MRN:  [id number]  Arrival date & time: 10/03/20     Chief Complaint   rectal prolapse   History of Present Illness   Christina Walter is a 86 y.o. year-old female with a history of bladder prolapse presenting to the ED with chief complaint of rectal prolapse.  Patient is endorsing intermittent rectal prolapse over the past several days.  Granddaughter has been able to easily reduce at home, and sometimes it reduces on its own.  Denies any rectal pain, no bleeding, no fever, no abdominal pain, no other complaints.  Also has a bladder prolapse which has been an issue for years.  Unable to see general surgery in the office, here for repeat evaluation.  Review of Systems  A complete 10 system review of systems was obtained and all systems are negative except as noted in the HPI and PMH.   Patient's Health History    Past Medical History:  Diagnosis Date   Female bladder prolapse    Thyroid disease     Past Surgical History:  Procedure Laterality Date   TOTAL HIP ARTHROPLASTY      History reviewed. No pertinent family history.  Social History   Socioeconomic History   Marital status: Widowed    Spouse name: Not on file   Number of children: Not on file   Years of education: Not on file   Highest education level: Not on file  Occupational History   Not on file  Tobacco Use   Smoking status: Never   Smokeless tobacco: Never  Substance and Sexual Activity   Alcohol use: Never   Drug use: Never   Sexual activity: Not on file  Other Topics Concern   Not on file  Social History Narrative   Not on file   Social Determinants of Health   Financial Resource Strain: Not on file  Food Insecurity: Not on file  Transportation Needs: Not on file  Physical Activity: Not on file  Stress: Not on file  Social Connections: Not on file  Intimate Partner Violence: Not on file     Physical Exam   Vitals:    10/03/20 2200 10/03/20 2300  BP: 130/69 124/71  Pulse: 78 70  Resp: 18 18  Temp:    SpO2: 100% 98%    CONSTITUTIONAL: Well-appearing, NAD NEURO:  Alert and oriented x 3, no focal deficits EYES:  eyes equal and reactive ENT/NECK:  no LAD, no JVD CARDIO: Regular rate, well-perfused, normal S1 and S2 PULM:  CTAB no wheezing or rhonchi GI/GU:  normal bowel sounds, non-distended, non-tender; bladder visualized within the vagina, anus normal-appearing, rectum is not prolapsed MSK/SPINE:  No gross deformities, no edema SKIN:  no rash, atraumatic PSYCH:  Appropriate speech and behavior  *Additional and/or pertinent findings included in MDM below  Diagnostic and Interventional Summary    EKG Interpretation  Date/Time:    Ventricular Rate:    PR Interval:    QRS Duration:   QT Interval:    QTC Calculation:   R Axis:     Text Interpretation:         Labs Reviewed  COMPREHENSIVE METABOLIC PANEL - Abnormal; Notable for the following components:      Result Value   Glucose, Bld 103 (*)    All other components within normal limits  CBC WITH DIFFERENTIAL/PLATELET    No orders to display    Medications - No data to display   Procedures  /  Critical Care Procedures  ED Course and Medical Decision Making  I have reviewed the triage vital signs, the nursing notes, and pertinent available records from the EMR.  Listed above are laboratory and imaging tests that I personally ordered, reviewed, and interpreted and then considered in my medical decision making (see below for details).  Intermittent rectal prolapse, new condition over the past several days.  Currently not prolapsed.  No symptoms, normal vital signs.  Mostly here for reassurance and further instruction on how to manage this at home until they can see the surgeon patient is moving to [place] next week, and so we discussed I would probably be preferable for her to follow-up with the surgeon there.  Strict return  precautions for prolapse that cannot be reduced, severe pain, bleeding.       Elmer Sow. Pilar Plate, MD [facility] Emergency Medicine [facility] [email or web address]  Final Clinical Impressions(s) / ED Diagnoses     ICD-10-CM   1. Rectal prolapse  K62.3       ED Discharge Orders     None        Discharge Instructions Discussed with and Provided to Patient:    Discharge Instructions      You were evaluated in the Emergency Department and after careful evaluation, we did not find any emergent condition requiring admission or further testing in the hospital.  Your exam/testing today was overall reassuring.  Recommend follow-up with a general surgeon to evaluate this condition and set up possible surgical correction.  You may also need a GYN surgeon to help with your bladder prolapse.  Please return to the Emergency Department if you experience any worsening of your condition.  Thank you for allowing [place] to be a part of your care.        Sabas Sous, MD 10/03/20 [phone]

## 2020-10-03 NOTE — Discharge Instructions (Addendum)
You were evaluated in the Emergency Department and after careful evaluation, we did not find any emergent condition requiring admission or further testing in the hospital.  Your exam/testing today was overall reassuring.  Recommend follow-up with a general surgeon to evaluate this condition and set up possible surgical correction.  You may also need a GYN surgeon to help with your bladder prolapse.  Please return to the Emergency Department if you experience any worsening of your condition.  Thank you for allowing [place] to be a part of your care.

## 2020-10-03 NOTE — ED Provider Notes (Signed)
Emergency Medicine Provider Triage Evaluation Note  Christina Walter , a 86 y.o. female  was evaluated in triage.  Pt complains of rectal prolapse that has been ongoing for the past 6 days.  Originally seen at [facility], given a referral for [facility] but next appointment is not until the end of August.  Patient is relocating to [place], unsure if she needs surgery sooner than this.  Family member at the bedside, is requesting surgeon on-call to evaluate her along with treatment plan.  Patient has had multiple episodes of diarrhea, she is taking stool softeners.  Family member at the bedside, has been able to reduce this with a warm compress.  No abdominal pain.  Review of Systems  Positive: Rectal prolapse Negative: Fever, abdominal pain, nausea, vomiting   Physical Exam  BP (!) 152/91   Pulse 91   Temp 98.2 F (36.8 C)   Resp 18   Ht 5\' 3"  (1.6 m)   Wt 49.9 kg   SpO2 94%   BMI 19.49 kg/m  Gen:   Awake, no distress   Resp:  Normal effort  MSK:   Moves extremities without difficulty  Other:    Medical Decision Making  Medically screening exam initiated at 8:43 PM.  Appropriate orders placed.  Christina Walter was informed that the remainder of the evaluation will be completed by another provider, this initial triage assessment does not replace that evaluation, and the importance of remaining in the ED until their evaluation is complete.  GU exam deferred, seeking general surgery consultation due to rectal prolapse.  Vitals are within normal limits, afebrile without any abdominal pain.  No nausea or vomiting.  Screening lab work has been ordered.   Francella Solian, PA-C 10/03/20 2045    2046, MD 10/04/20 1019

## 2022-12-01 IMAGING — DX DG CHEST 1V PORT
1 series · 1 of 1 positions shown · non-contrast
Comparison: None.

CLINICAL DATA: Cough.

EXAM:
PORTABLE CHEST 1 VIEW

[chest ap]
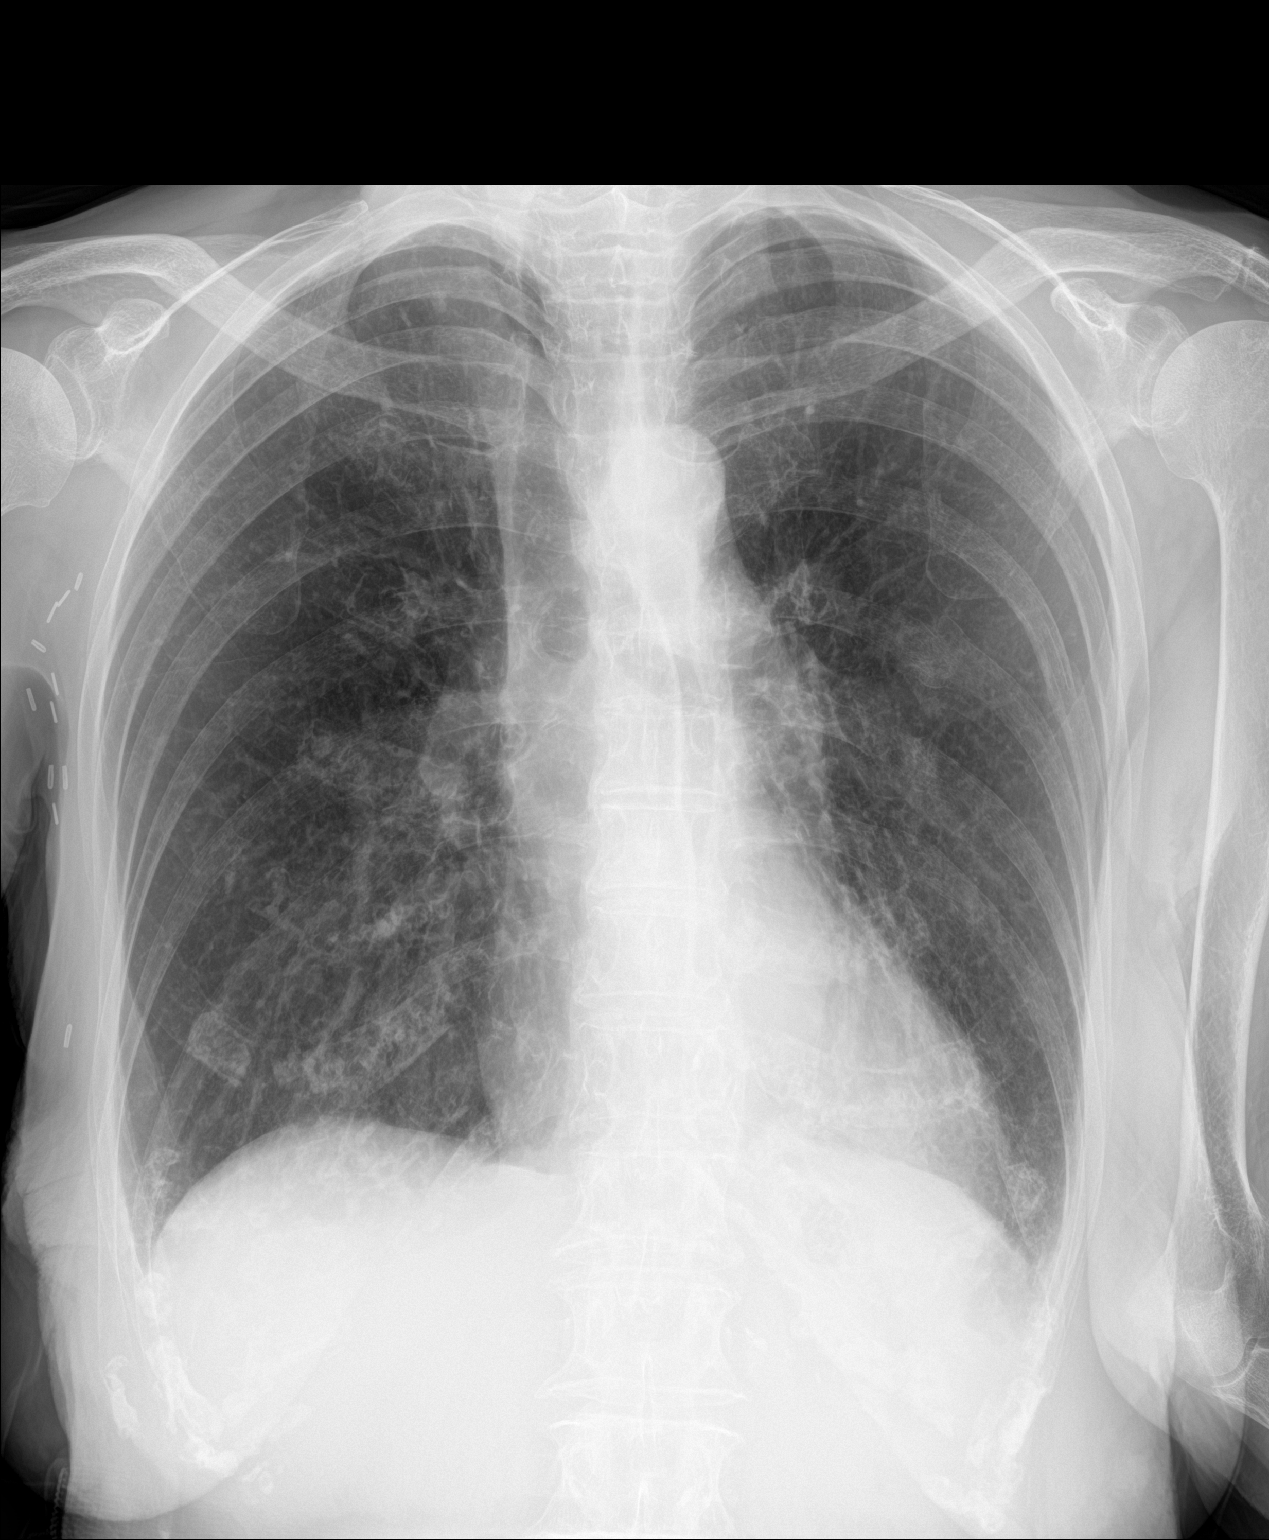

[1 of 1 positions shown; findings below may reference images not displayed]

FINDINGS: Multiple mildly displaced left posterior rib fractures. Prominent
calcified costochondral cartilage at multiple levels. Left basilar
opacities. No visible pleural effusions or pneumothorax. Right
axillary clips.
IMPRESSION: 1. Left basilar opacities, which could represent atelectasis,
aspiration, and/or pneumonia.
2. Multiple mildly displaced left posterior rib fractures, age
indeterminate without priors but possibly remote. Recommend
correlation with a history of trauma or tenderness.

## 2023-02-16 IMAGING — CT CT ABD-PELV W/ CM
2 of 5 series · 15 of 46 positions shown, 17 images · IV contrast (Omnipaque or Isovue)
Comparison: None.

CLINICAL DATA: Diarrhea, abdominal pain, acute, nonlocalized rectal
prolapse.

EXAM:
CT ABDOMEN AND PELVIS WITH CONTRAST
TECHNIQUE: Multidetector CT imaging of the abdomen and pelvis was performed
using the standard protocol following bolus administration of
intravenous contrast.
CONTRAST:  75mL OMNIPAQUE IOHEXOL 300 MG/ML  SOLN

[Series 2: axial st · axial · 0.61mm/px · z∈[+802,+1152]mm · 12 of 84 slices shown, 14 images]
[im 7/84  soft-tissue]
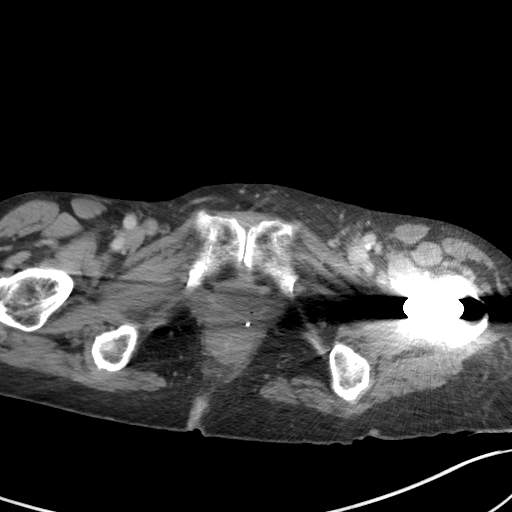
[im 7/84  bone]
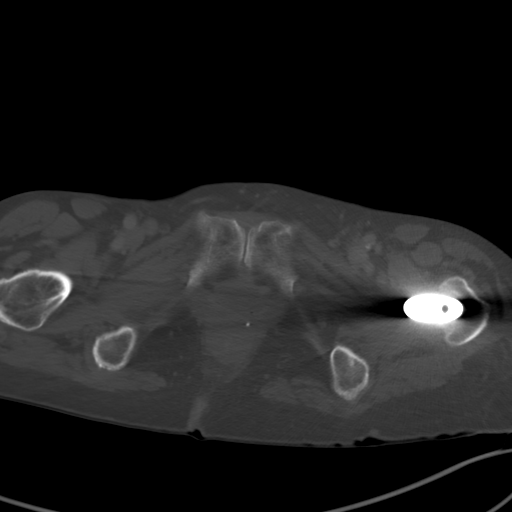
[im 13/84  soft-tissue]
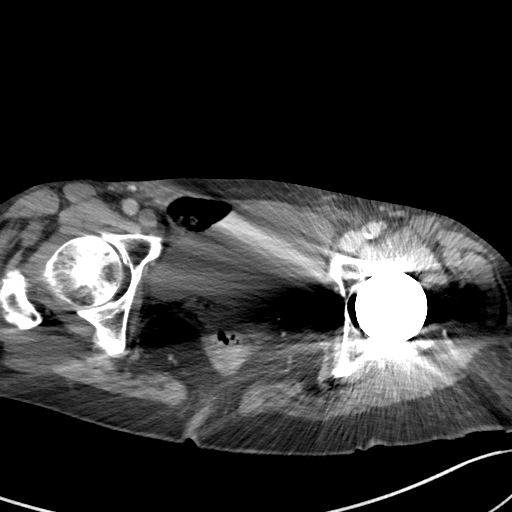
[im 20/84  soft-tissue]
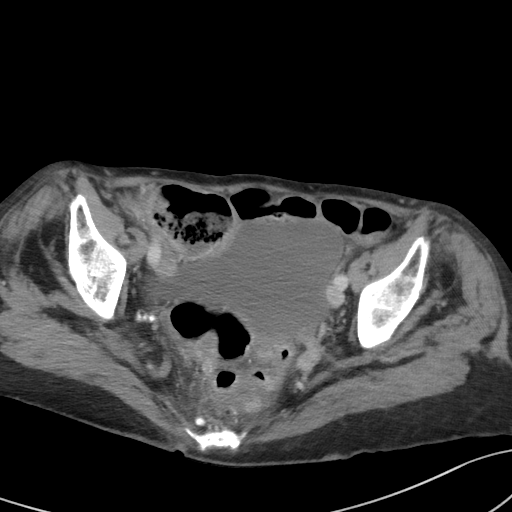
[im 26/84  soft-tissue]
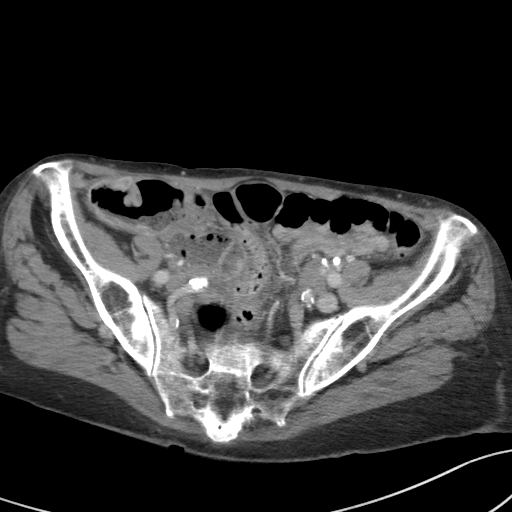
[im 32/84  soft-tissue]
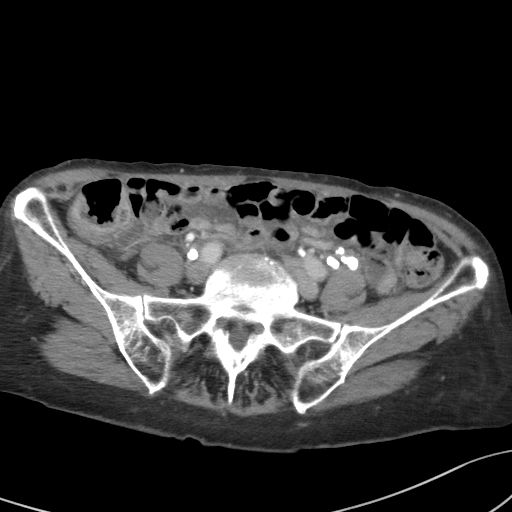
[im 39/84  soft-tissue]
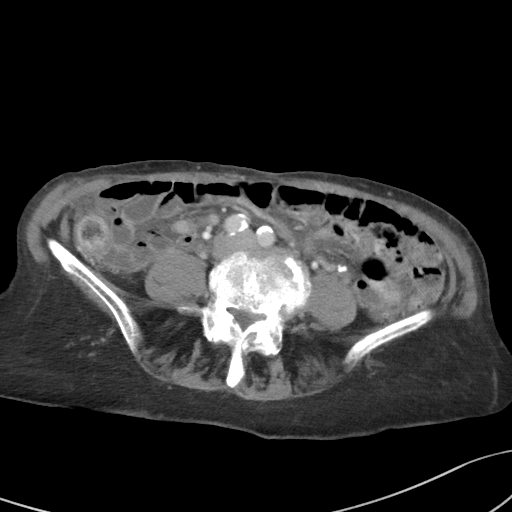
[im 45/84  soft-tissue]
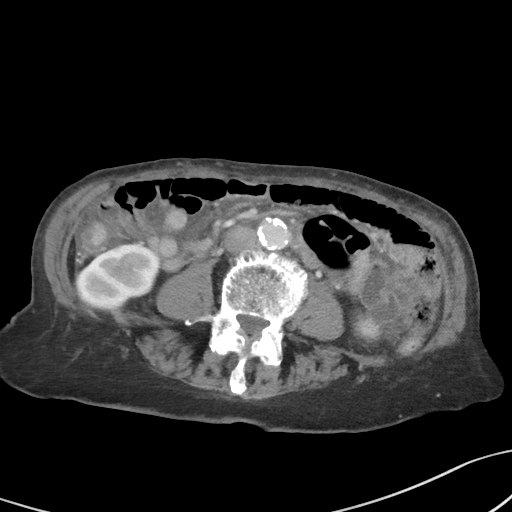
[im 52/84  soft-tissue]
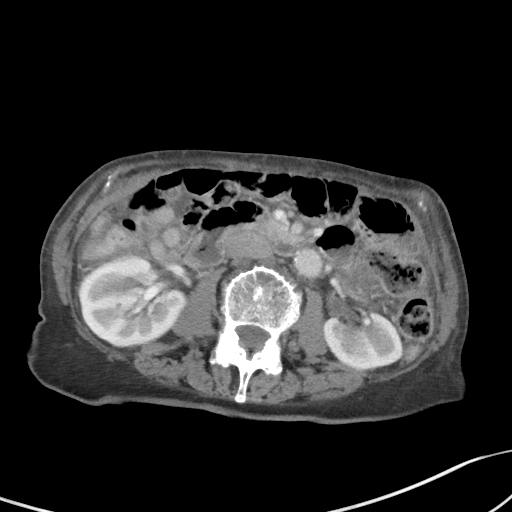
[im 58/84  soft-tissue]
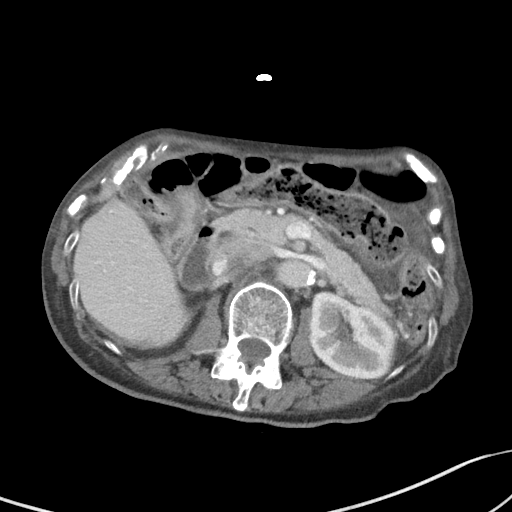
[im 58/84  bone]
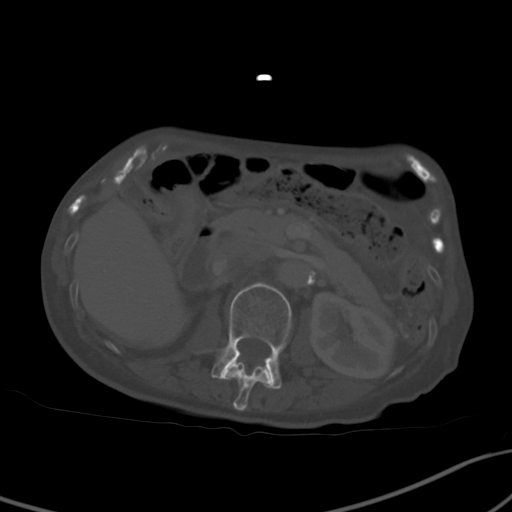
[im 64/84  soft-tissue]
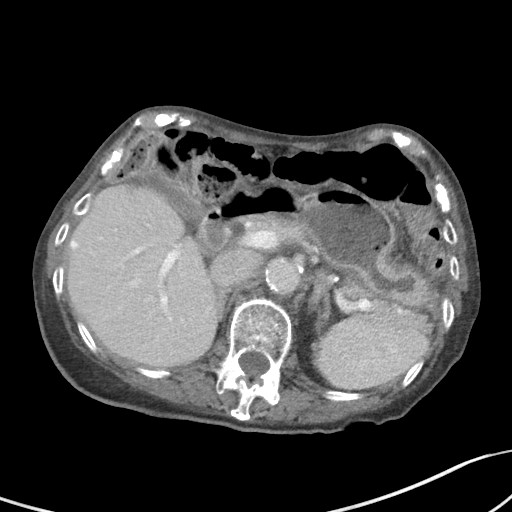
[im 71/84  soft-tissue]
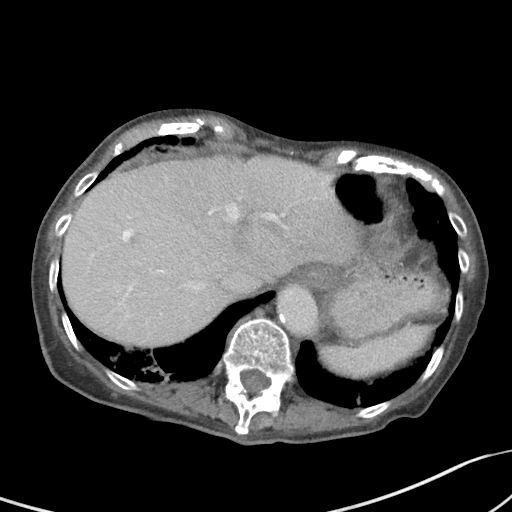
[im 77/84  soft-tissue]
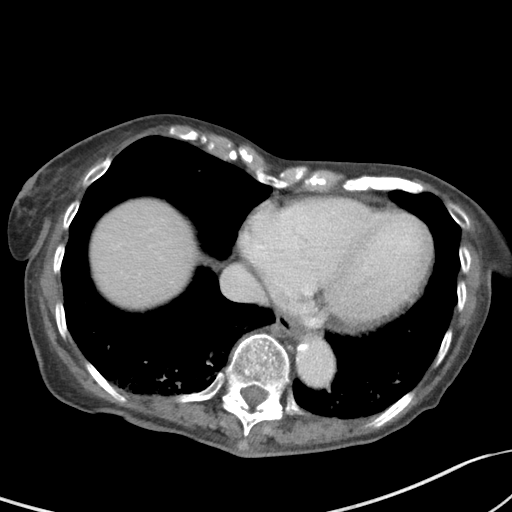

[Series 5: coronal st · coronal · 0.72mm/px · 3 of 62 slices shown]
[im 21/62  soft-tissue]
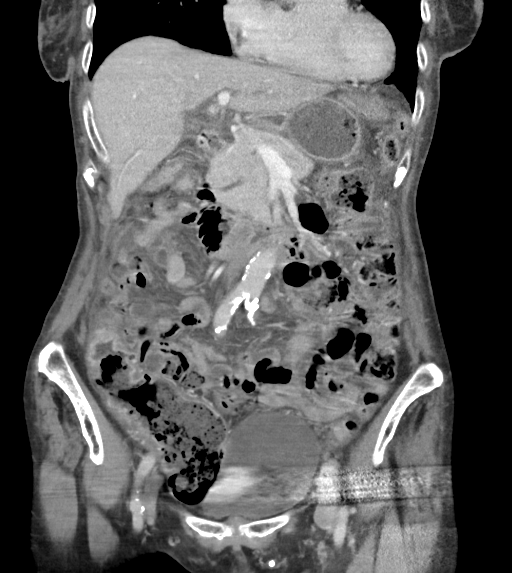
[im 28/62  soft-tissue]
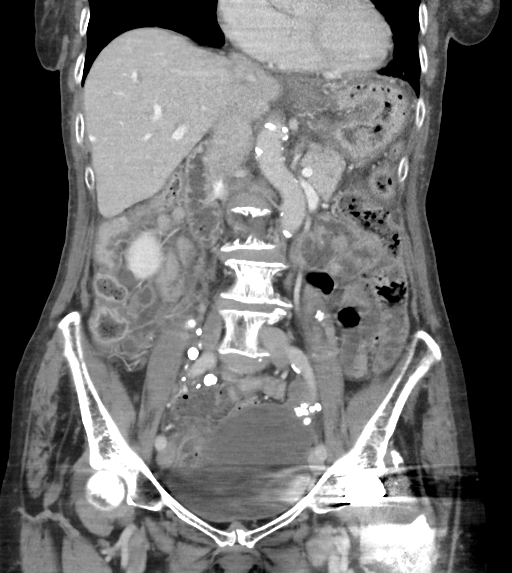
[im 34/62  soft-tissue]
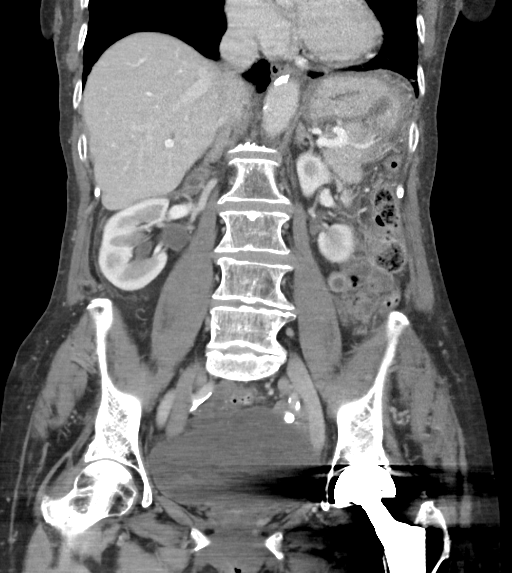

[15 of 46 positions shown; findings below may reference images not displayed]

FINDINGS: Lower chest: Mild bibasilar pulmonary fibrotic change. Mild
cylindrical bronchiectasis with scattered foci of airway impaction.
No superimposed confluent pulmonary infiltrate. Cardiac size within
normal limits.

Hepatobiliary: Gallbladder unremarkable. Liver unremarkable. No
significant intra or extrahepatic biliary ductal dilation.

Pancreas: Unremarkable

Spleen: Unremarkable

Adrenals/Urinary Tract: The adrenal glands are unremarkable. The
kidneys are normal in size and position. Tiny cortical cysts are
seen within the a mid and lower pole of the right kidney. The
kidneys are otherwise unremarkable. The bladder is partially
obscured by streak artifact from left total hip arthroplasty,
however, the visualize component is unremarkable peer

Stomach/Bowel: The stomach, small bowel, and large bowel are
unremarkable. The appendix is not clearly identified, however, there
are no secondary signs of appendicitis within the right lower
quadrant of the abdomen. No free intraperitoneal gas or fluid.

Vascular/Lymphatic: Moderate aortoiliac atherosclerotic
calcification. No aortic aneurysm. No pathologic adenopathy within
the abdomen and pelvis.

Reproductive: Status post hysterectomy. No adnexal masses.

Other: Numerous phleboliths are seen within the pelvis. No abdominal
wall hernia. The rectum is unremarkable.

Musculoskeletal: Left total hip arthroplasty has been performed.
Osseous structures are diffusely osteopenic. No acute bone
abnormality.
IMPRESSION: No acute intra-abdominal pathology identified. Noted if note
radiographic explanation for the patient's reported symptoms. No
evidence of rectal prolapse.

Mild bibasilar bronchiectasis with scattered foci of airway
impaction, possibly infectious or inflammatory.

Aortic Atherosclerosis ([id number]).
